# Patient Record
Sex: Female | Born: 1955
Health system: Southern US, Community
[De-identification: ages and names within clinical notes are randomized; demographics above are authoritative.]

## PROBLEM LIST (undated history)

## (undated) DIAGNOSIS — R112 Nausea with vomiting, unspecified: Secondary | ICD-10-CM

## (undated) DIAGNOSIS — R351 Nocturia: Secondary | ICD-10-CM

## (undated) DIAGNOSIS — N3010 Interstitial cystitis (chronic) without hematuria: Secondary | ICD-10-CM

## (undated) DIAGNOSIS — R3989 Other symptoms and signs involving the genitourinary system: Secondary | ICD-10-CM

## (undated) DIAGNOSIS — E785 Hyperlipidemia, unspecified: Secondary | ICD-10-CM

## (undated) DIAGNOSIS — R3915 Urgency of urination: Secondary | ICD-10-CM

## (undated) DIAGNOSIS — R350 Frequency of micturition: Secondary | ICD-10-CM

## (undated) DIAGNOSIS — Z9889 Other specified postprocedural states: Secondary | ICD-10-CM

## (undated) HISTORY — PX: TUBAL LIGATION: SHX77

## (undated) HISTORY — PX: PILONIDAL CYST EXCISION: SHX744

## (undated) HISTORY — PX: TONSILLECTOMY: SUR1361

## (undated) HISTORY — DX: Hyperlipidemia, unspecified: E78.5

## (undated) HISTORY — PX: DILATION AND CURETTAGE OF UTERUS: SHX78

---

## 1997-12-06 ENCOUNTER — Ambulatory Visit (HOSPITAL_COMMUNITY): Admission: RE | Admit: 1997-12-06 | Discharge: 1997-12-06 | Payer: Self-pay | Admitting: Obstetrics and Gynecology

## 1997-12-09 ENCOUNTER — Ambulatory Visit (HOSPITAL_COMMUNITY): Admission: RE | Admit: 1997-12-09 | Discharge: 1997-12-09 | Payer: Self-pay | Admitting: Obstetrics and Gynecology

## 1998-12-01 ENCOUNTER — Other Ambulatory Visit: Admission: RE | Admit: 1998-12-01 | Discharge: 1998-12-01 | Payer: Self-pay | Admitting: Internal Medicine

## 1998-12-12 ENCOUNTER — Ambulatory Visit (HOSPITAL_COMMUNITY): Admission: RE | Admit: 1998-12-12 | Discharge: 1998-12-12 | Payer: Self-pay | Admitting: Internal Medicine

## 1998-12-12 ENCOUNTER — Encounter: Payer: Self-pay | Admitting: Internal Medicine

## 1999-12-14 ENCOUNTER — Ambulatory Visit (HOSPITAL_COMMUNITY): Admission: RE | Admit: 1999-12-14 | Discharge: 1999-12-14 | Payer: Self-pay | Admitting: Internal Medicine

## 1999-12-14 ENCOUNTER — Encounter: Payer: Self-pay | Admitting: Internal Medicine

## 2000-01-14 ENCOUNTER — Other Ambulatory Visit: Admission: RE | Admit: 2000-01-14 | Discharge: 2000-01-14 | Payer: Self-pay | Admitting: Internal Medicine

## 2000-12-22 ENCOUNTER — Ambulatory Visit (HOSPITAL_COMMUNITY): Admission: RE | Admit: 2000-12-22 | Discharge: 2000-12-22 | Payer: Self-pay | Admitting: Internal Medicine

## 2000-12-22 ENCOUNTER — Encounter: Payer: Self-pay | Admitting: Internal Medicine

## 2001-04-02 ENCOUNTER — Other Ambulatory Visit: Admission: RE | Admit: 2001-04-02 | Discharge: 2001-04-02 | Payer: Self-pay | Admitting: Obstetrics and Gynecology

## 2001-12-23 ENCOUNTER — Ambulatory Visit (HOSPITAL_COMMUNITY): Admission: RE | Admit: 2001-12-23 | Discharge: 2001-12-23 | Payer: Self-pay | Admitting: Internal Medicine

## 2001-12-23 ENCOUNTER — Encounter: Payer: Self-pay | Admitting: Internal Medicine

## 2003-01-11 ENCOUNTER — Ambulatory Visit (HOSPITAL_COMMUNITY): Admission: RE | Admit: 2003-01-11 | Discharge: 2003-01-11 | Payer: Self-pay | Admitting: Internal Medicine

## 2003-01-25 ENCOUNTER — Other Ambulatory Visit: Admission: RE | Admit: 2003-01-25 | Discharge: 2003-01-25 | Payer: Self-pay | Admitting: Internal Medicine

## 2004-01-27 ENCOUNTER — Other Ambulatory Visit: Admission: RE | Admit: 2004-01-27 | Discharge: 2004-01-27 | Payer: Self-pay | Admitting: Internal Medicine

## 2004-02-07 ENCOUNTER — Ambulatory Visit (HOSPITAL_COMMUNITY): Admission: RE | Admit: 2004-02-07 | Discharge: 2004-02-07 | Payer: Self-pay | Admitting: Internal Medicine

## 2004-03-21 ENCOUNTER — Ambulatory Visit (HOSPITAL_COMMUNITY): Admission: RE | Admit: 2004-03-21 | Discharge: 2004-03-21 | Payer: Self-pay | Admitting: Orthopedic Surgery

## 2004-04-06 ENCOUNTER — Ambulatory Visit (HOSPITAL_COMMUNITY): Admission: RE | Admit: 2004-04-06 | Discharge: 2004-04-06 | Payer: Self-pay | Admitting: Orthopedic Surgery

## 2004-04-06 ENCOUNTER — Ambulatory Visit (HOSPITAL_BASED_OUTPATIENT_CLINIC_OR_DEPARTMENT_OTHER): Admission: RE | Admit: 2004-04-06 | Discharge: 2004-04-06 | Payer: Self-pay | Admitting: Orthopedic Surgery

## 2004-04-06 HISTORY — PX: OTHER SURGICAL HISTORY: SHX169

## 2005-02-26 ENCOUNTER — Ambulatory Visit (HOSPITAL_COMMUNITY): Admission: RE | Admit: 2005-02-26 | Discharge: 2005-02-26 | Payer: Self-pay | Admitting: Internal Medicine

## 2005-04-23 ENCOUNTER — Other Ambulatory Visit: Admission: RE | Admit: 2005-04-23 | Discharge: 2005-04-23 | Payer: Self-pay | Admitting: Internal Medicine

## 2005-10-11 ENCOUNTER — Ambulatory Visit (HOSPITAL_BASED_OUTPATIENT_CLINIC_OR_DEPARTMENT_OTHER): Admission: RE | Admit: 2005-10-11 | Discharge: 2005-10-11 | Payer: Self-pay | Admitting: Urology

## 2005-10-11 HISTORY — PX: OTHER SURGICAL HISTORY: SHX169

## 2006-02-20 ENCOUNTER — Other Ambulatory Visit: Admission: RE | Admit: 2006-02-20 | Discharge: 2006-02-20 | Payer: Self-pay | Admitting: Internal Medicine

## 2006-02-28 ENCOUNTER — Ambulatory Visit (HOSPITAL_COMMUNITY): Admission: RE | Admit: 2006-02-28 | Discharge: 2006-02-28 | Payer: Self-pay | Admitting: Internal Medicine

## 2007-03-05 ENCOUNTER — Encounter: Admission: RE | Admit: 2007-03-05 | Discharge: 2007-03-05 | Payer: Self-pay | Admitting: Internal Medicine

## 2007-03-09 ENCOUNTER — Other Ambulatory Visit: Admission: RE | Admit: 2007-03-09 | Discharge: 2007-03-09 | Payer: Self-pay | Admitting: Internal Medicine

## 2008-03-15 ENCOUNTER — Encounter: Admission: RE | Admit: 2008-03-15 | Discharge: 2008-03-15 | Payer: Self-pay | Admitting: Internal Medicine

## 2008-03-17 ENCOUNTER — Ambulatory Visit: Payer: Self-pay | Admitting: Internal Medicine

## 2008-03-17 ENCOUNTER — Other Ambulatory Visit: Admission: RE | Admit: 2008-03-17 | Discharge: 2008-03-17 | Payer: Self-pay | Admitting: Internal Medicine

## 2008-04-07 ENCOUNTER — Encounter: Admission: RE | Admit: 2008-04-07 | Discharge: 2008-04-07 | Payer: Self-pay | Admitting: Internal Medicine

## 2008-04-12 ENCOUNTER — Ambulatory Visit: Payer: Self-pay | Admitting: Internal Medicine

## 2008-09-12 ENCOUNTER — Ambulatory Visit: Payer: Self-pay | Admitting: Internal Medicine

## 2009-04-03 ENCOUNTER — Encounter: Admission: RE | Admit: 2009-04-03 | Discharge: 2009-04-03 | Payer: Self-pay | Admitting: Internal Medicine

## 2009-09-11 ENCOUNTER — Ambulatory Visit: Payer: Self-pay | Admitting: Internal Medicine

## 2009-09-11 ENCOUNTER — Other Ambulatory Visit: Admission: RE | Admit: 2009-09-11 | Discharge: 2009-09-11 | Payer: Self-pay | Admitting: Internal Medicine

## 2010-03-15 ENCOUNTER — Other Ambulatory Visit: Payer: Self-pay | Admitting: Internal Medicine

## 2010-03-15 DIAGNOSIS — Z0000 Encounter for general adult medical examination without abnormal findings: Secondary | ICD-10-CM

## 2010-03-15 DIAGNOSIS — Z1231 Encounter for screening mammogram for malignant neoplasm of breast: Secondary | ICD-10-CM

## 2010-03-17 ENCOUNTER — Encounter: Payer: Self-pay | Admitting: Internal Medicine

## 2010-04-04 ENCOUNTER — Ambulatory Visit (HOSPITAL_COMMUNITY): Admission: RE | Admit: 2010-04-04 | Payer: Self-pay | Source: Home / Self Care | Admitting: Internal Medicine

## 2010-04-04 ENCOUNTER — Ambulatory Visit (HOSPITAL_COMMUNITY): Payer: Self-pay

## 2010-04-11 ENCOUNTER — Ambulatory Visit (HOSPITAL_COMMUNITY): Payer: Self-pay

## 2010-05-01 ENCOUNTER — Ambulatory Visit (HOSPITAL_COMMUNITY)
Admission: RE | Admit: 2010-05-01 | Discharge: 2010-05-01 | Disposition: A | Discharge status: Home / Self Care | Payer: 59 | Source: Ambulatory Visit | Attending: Internal Medicine | Admitting: Internal Medicine

## 2010-05-01 DIAGNOSIS — Z1231 Encounter for screening mammogram for malignant neoplasm of breast: Secondary | ICD-10-CM

## 2010-07-10 ENCOUNTER — Other Ambulatory Visit: Payer: Self-pay | Admitting: Internal Medicine

## 2010-07-10 DIAGNOSIS — Z792 Long term (current) use of antibiotics: Secondary | ICD-10-CM

## 2010-07-10 NOTE — Telephone Encounter (Signed)
Pt requesting refill for Valtrex for prophylaxis.

## 2010-07-10 NOTE — Telephone Encounter (Signed)
Refill prn one year 

## 2010-07-13 NOTE — Op Note (Signed)
Jennifer Reyes, Jennifer Reyes NO.:  [id number]   MEDICAL RECORD NO.:  [id number]          PATIENT TYPE:  AMB   LOCATION:  [facility]                         FACILITY:  [facility]   PHYSICIAN:  Jamison Neighbor, M.D.  DATE OF BIRTH:  [birth year]   DATE OF PROCEDURE:  10/11/2005  DATE OF DISCHARGE:                                 OPERATIVE REPORT   SERVICE:  Urology.   PREOPERATIVE DIAGNOSIS:  Interstitial cystitis.   POSTOPERATIVE DIAGNOSIS:  Interstitial cystitis.   PROCEDURE:  Cystoscopy, urethral calibration, hydrodistention of the  bladder, Marcaine and Pyridium installation, Marcaine and Kenalog injection.   SURGEON:  Jamison Neighbor, M.D.   ANESTHESIA:  General.   COMPLICATIONS:  None.   DRAINS:  None.   BRIEF HISTORY:  This 55 year old female has known interstitial cystitis and  was managed for many years by Dr. Retia Passe. She primarily was treated  with cystoscopy, hydrodistention and Clorpactin installations. Her last  hydrodistention was back in the 1990s. The patient has been on oral therapy  for quite some time now with Elmiron and has done reasonably well but has  had some worsening of her symptoms and is interested in seeing if something  further can be done. Given the fact that she is already on Elmiron,  Ditropan, hydroxyzine, Klonopin, pain medication, Estrace vaginal cream and  antibiotic therapy, it is thought that she would need to either look at  repeat hydrodistention or an installation therapy. She likes the idea of a  repeat hydrodistention. She is interested in seeing the status of her  bladder. She gave full informed consent.   DESCRIPTION OF PROCEDURE:  After successful induction of general anesthesia,  the patient was placed in the dorsal lithotomy position, prepped with  Betadine and draped in the usual sterile fashion. Careful bimanual  examination revealed no cystocele, rectocele or enterocele. The uterus was  palpably normal, the urethra  was free of any lesions with no signs of  diverticulum. The urethra was calibrated at [address] [place] with female urethral  sounds with no evidence of stenosis or stricture. The cystoscope was  inserted, the bladder was carefully inspected and was free of any tumor or  stones. Both ureteral orifices were normal in configuration and location.  Hydrodistention of the bladder was then performed and the bladder was  distended at a pressure of 100 cmH2O for 5 minutes. When the bladder was  drained, glomerulations could be seen throughout the bladder consistent with  interstitial cystitis. Fortunately however she had an excellent bladder  capacity of 900 mL which is approaching normal. The average for IC is  approximately 575 and the average normal bladder capacity is 1150. The  patient had no Hunner's ulcers and there was nothing that required biopsy.  The patient's bladder was drained and a mixture of Marcaine and Pyridium  was left within the bladder, Marcaine and Kenalog were injected  periurethrally. The patient had an intraoperative B&O suppository as well as  Toradol and Zofran. She tolerated the procedure well and was taken to the  recovery room in good condition.  ______________________________  Jamison Neighbor, M.D.  Electronically Signed     RJE/MEDQ  D:  10/11/2005  T:  10/11/2005  Job:  [id number]   cc:   Laurier Nancy, M.D.  Fax: [phone]   Luanna Cole. Lenord Fellers, M.D.  Fax: [phone]

## 2010-07-13 NOTE — Op Note (Signed)
[name], [name]                ACCOUNT NO.:  [id number]   MEDICAL RECORD NO.:  [id number]          PATIENT TYPE:  AMB   LOCATION:  [facility]                          FACILITY:  [facility]   PHYSICIAN:  Katy Fitch. Sypher Montez Hageman., M.D.DATE OF BIRTH:  31-Oct-1955   DATE OF PROCEDURE:  04/06/2004  DATE OF DISCHARGE:                                 OPERATIVE REPORT   PREOPERATIVE DIAGNOSIS:  Chronic adhesive capsulitis left shoulder.   POSTOPERATIVE DIAGNOSIS:  Chronic adhesive capsulitis left shoulder.   OPERATION:  Examination of left shoulder under anesthesia followed by gentle  manipulation of adhesive capsulitis to release adhesions.   OPERATION SURGEON:  Katy Fitch. Sypher, M.D.   ASSISTANT:  Marveen Reeks. Dasnoit, P.A.C.   ANESTHESIA:  General by mask supplemented by infraclavicular block.   SUPERVISING ANESTHESIOLOGIST:  Dr. Gelene Mink.   INDICATIONS:  Jennifer Reyes is a 56 year old right-hand dominant woman who  presented more than four months prior with a history of increasing pain and  stiffness of left shoulder.   An interesting circumstance is that her mother experienced a similar cyst  predicament perhaps 10 years prior.   She developed a spontaneous onset of stiffness and pain in left shoulder  without antecedent injury.   Clinical examination suggested adhesive capsulitis of the left shoulder.  Plain films of the shoulder were unremarkable.   She was enrolled in a therapy program under the supervision of an  experienced physical therapist for three months.   Despite maximum efforts on her part, she did not make significant progress  with recovering motion.   We then sent her for an MRI which documented no significant shoulder  pathology other than adhesive capsulitis findings.   We recommended proceeding with manipulation of the left shoulder at this  time.   PROCEDURE:  Jennifer Reyes is brought to the operating room and placed in  supine position on the operating table.   Following the induction of general anesthesia by mask technique and  following placement of the infraclavicular block holding area by Dr.  Gelene Mink, the left arm was carefully ranged.   Initial range of motion revealed elevation of 120 degrees and external  rotation of only 50 degrees with her scapula stabilized. She could  internally rotate 0 degrees.   After gentle manipulation with a combined maneuver of elevation and external  rotation, we were able to increase her combined elevation of the shoulder to  175 degrees and her external rotation of the scapula stabilized to 95  degrees. Internal rotation was increased to approximately 70 degrees.   After alcohol and Betadine prep of the anterior aspect the shoulder, a  mixture of 1 mL of Depo-Medrol 40 mg per mL and 1% plain lidocaine was  injected directly into the glenohumeral joint capsule taking care to avoid  the hyaline articular cartilage of the humeral head.   Injection was accomplished without difficulty.   [name] was then awakened from sedation and transferred to the recovery  room with stable vital signs.   There were no apparent complications.      RVS/MEDQ  D:  04/06/2004  T:  04/06/2004  Job:  [id number]

## 2011-04-15 ENCOUNTER — Other Ambulatory Visit: Payer: Self-pay | Admitting: Internal Medicine

## 2011-04-15 DIAGNOSIS — Z1231 Encounter for screening mammogram for malignant neoplasm of breast: Secondary | ICD-10-CM

## 2011-05-03 ENCOUNTER — Other Ambulatory Visit: Payer: Self-pay | Admitting: Internal Medicine

## 2011-05-03 ENCOUNTER — Ambulatory Visit
Admission: RE | Admit: 2011-05-03 | Discharge: 2011-05-03 | Disposition: A | Discharge status: Home / Self Care | Payer: BC Managed Care – PPO | Source: Ambulatory Visit | Attending: Internal Medicine | Admitting: Internal Medicine

## 2011-05-03 DIAGNOSIS — R05 Cough: Secondary | ICD-10-CM

## 2011-05-03 DIAGNOSIS — R059 Cough, unspecified: Secondary | ICD-10-CM

## 2011-05-20 ENCOUNTER — Ambulatory Visit (HOSPITAL_COMMUNITY)
Admission: RE | Admit: 2011-05-20 | Discharge: 2011-05-20 | Disposition: A | Discharge status: Home / Self Care | Payer: BC Managed Care – PPO | Source: Ambulatory Visit | Attending: Internal Medicine | Admitting: Internal Medicine

## 2011-05-20 DIAGNOSIS — Z1231 Encounter for screening mammogram for malignant neoplasm of breast: Secondary | ICD-10-CM | POA: Insufficient documentation

## 2011-05-23 ENCOUNTER — Encounter: Payer: Self-pay | Admitting: Cardiology

## 2011-05-27 ENCOUNTER — Encounter: Payer: Self-pay | Admitting: *Deleted

## 2011-05-27 NOTE — Progress Notes (Signed)
This encounter was created in error - please disregard.

## 2011-06-10 ENCOUNTER — Other Ambulatory Visit: Payer: 59 | Admitting: Internal Medicine

## 2011-06-13 ENCOUNTER — Ambulatory Visit: Payer: 59 | Admitting: Internal Medicine

## 2011-07-11 ENCOUNTER — Other Ambulatory Visit: Payer: Self-pay | Admitting: Internal Medicine

## 2011-07-16 ENCOUNTER — Other Ambulatory Visit: Payer: BC Managed Care – PPO | Admitting: Internal Medicine

## 2011-07-16 DIAGNOSIS — Z0000 Encounter for general adult medical examination without abnormal findings: Secondary | ICD-10-CM

## 2011-07-16 LAB — COMPREHENSIVE METABOLIC PANEL
AST: 17 U/L (ref 0–37)
Albumin: 4.7 g/dL (ref 3.5–5.2)
Alkaline Phosphatase: 45 U/L (ref 39–117)
BUN: 20 mg/dL (ref 6–23)
Potassium: 4.3 mEq/L (ref 3.5–5.3)
Total Bilirubin: 0.6 mg/dL (ref 0.3–1.2)

## 2011-07-16 LAB — LIPID PANEL
HDL: 63 mg/dL (ref >39)
LDL Cholesterol: 96 mg/dL (ref 0–99)
Total CHOL/HDL Ratio: 2.7 Ratio
Triglycerides: 62 mg/dL (ref <150)
VLDL: 12 mg/dL (ref 0–40)

## 2011-07-16 LAB — TSH: TSH: 1.539 u[IU]/mL (ref 0.350–4.500)

## 2011-07-18 ENCOUNTER — Encounter: Payer: Self-pay | Admitting: Internal Medicine

## 2011-07-18 ENCOUNTER — Ambulatory Visit (INDEPENDENT_AMBULATORY_CARE_PROVIDER_SITE_OTHER): Payer: BC Managed Care – PPO | Admitting: Internal Medicine

## 2011-07-18 VITALS — BP 108/66 | HR 76 | Ht 63.25 in | Wt 99.0 lb

## 2011-07-18 DIAGNOSIS — N3010 Interstitial cystitis (chronic) without hematuria: Secondary | ICD-10-CM

## 2011-07-18 DIAGNOSIS — Z0000 Encounter for general adult medical examination without abnormal findings: Secondary | ICD-10-CM

## 2011-07-18 LAB — POCT URINALYSIS DIPSTICK
Blood, UA: NEGATIVE
Ketones, UA: NEGATIVE
Protein, UA: NEGATIVE
Spec Grav, UA: 1.01
Urobilinogen, UA: NEGATIVE
pH, UA: 6

## 2011-08-07 ENCOUNTER — Telehealth: Payer: Self-pay | Admitting: Internal Medicine

## 2011-08-07 NOTE — Telephone Encounter (Signed)
Dx:  733.90 (osteopenia).  Sp w/Teresa @ [facility] [phone] adv them of this dx.

## 2011-08-07 NOTE — Telephone Encounter (Signed)
Sp w/Teresa @ [facility] and she asked that I send that order over with the diagnosis since patient had the original order in her possession.  Faxed order w/dx 733.90 (osteopenia).  Adv pt of this info.

## 2011-08-08 ENCOUNTER — Telehealth: Payer: Self-pay

## 2011-08-08 ENCOUNTER — Other Ambulatory Visit: Payer: Self-pay | Admitting: Internal Medicine

## 2011-08-08 DIAGNOSIS — M899 Disorder of bone, unspecified: Secondary | ICD-10-CM

## 2011-08-09 NOTE — Telephone Encounter (Signed)
Chart opened in error

## 2011-08-21 ENCOUNTER — Other Ambulatory Visit: Payer: BC Managed Care – PPO

## 2011-08-22 ENCOUNTER — Ambulatory Visit
Admission: RE | Admit: 2011-08-22 | Discharge: 2011-08-22 | Disposition: A | Discharge status: Home / Self Care | Payer: BC Managed Care – PPO | Source: Ambulatory Visit | Attending: Internal Medicine | Admitting: Internal Medicine

## 2011-08-22 DIAGNOSIS — M949 Disorder of cartilage, unspecified: Secondary | ICD-10-CM

## 2011-08-22 DIAGNOSIS — M899 Disorder of bone, unspecified: Secondary | ICD-10-CM

## 2011-08-27 ENCOUNTER — Encounter: Payer: Self-pay | Admitting: Internal Medicine

## 2011-08-27 DIAGNOSIS — N3010 Interstitial cystitis (chronic) without hematuria: Secondary | ICD-10-CM | POA: Insufficient documentation

## 2011-08-27 NOTE — Progress Notes (Signed)
  Subjective:    Patient ID: Jennifer Reyes, female    DOB: [birth year], 56 y.o.   MRN: [id number]  HPI pleasant 56 year old white female with history of interstitial cystitis treated by urologist for many years in today for health maintenance. Evaluated for palpitations 1991, history of left frozen shoulder and right frozen shoulder. Had tonsillectomy 1961, pilonidal cyst 1974. Multiple urethral dilatations 1970 06/13/1968 08/14/1979. D&C for miscarriage 1985. Anal fissure repair with Clorpactin treatment 03/02/1992. Hydrodilatation of bladder and cystoscopy August 1997. She is allergic penicillin causes rash and hives. Also allergic to bee stings. Tetanus immunization 04/23/2005. Gets annual influenza immunization through employment and has had recent PPD which was negative.  Social history married one adult son. Husband is a for physiatrist.  Family history: Father with history of coronary artery bypass graft hypertension. Mother in good health. 3 sisters- one of them has Mnire's disease.  Patient is a nonsmoker. Does not consume alcohol. 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.    Review of Systems  Constitutional: Negative.   HENT: Negative.   Eyes: Negative.   Respiratory: Negative.   Cardiovascular: Negative.   Gastrointestinal: Negative.   Genitourinary:       History of interstitial cystitis  Musculoskeletal: Negative.   Neurological: Negative.   Hematological: Negative.   Psychiatric/Behavioral: Negative.        Objective:   Physical Exam  Vitals reviewed. Constitutional: She appears well-developed and well-nourished. No distress.  HENT:  Head: Normocephalic and atraumatic.  Right Ear: External ear normal.  Left Ear: External ear normal.  Mouth/Throat: Oropharynx is clear and moist. No oropharyngeal exudate.  Eyes: Conjunctivae and EOM are normal. Pupils are equal, round, and reactive to light. Right eye exhibits no discharge. Left eye  exhibits no discharge. No scleral icterus.  Neck: Neck supple. No JVD present. No thyromegaly present.  Cardiovascular: Normal rate, regular rhythm, normal heart sounds and intact distal pulses.   No murmur heard. Pulmonary/Chest: Effort normal. No respiratory distress. She has no wheezes. She has no rales. She exhibits no tenderness.       Breasts normal female  Abdominal: Soft. Bowel sounds are normal. She exhibits no distension and no mass. There is no tenderness. There is no rebound and no guarding.  Musculoskeletal: She exhibits no edema.  Lymphadenopathy:    She has no cervical adenopathy.  Neurological: Coordination normal.  Skin: Skin is warm and dry. No rash noted. She is not diaphoretic.  Psychiatric: She has a normal mood and affect. Her behavior is normal. Judgment and thought content normal.          Assessment & Plan:   history of interstitial cystitis followed by urology  Plan return one year or as needed. She had colonoscopy 09/25/2007. Recommend annual mammogram. Recommend bone density study every 2 years. Patient did have Zostavax vaccine a few years ago.

## 2011-09-27 ENCOUNTER — Telehealth: Payer: Self-pay | Admitting: Internal Medicine

## 2011-09-27 DIAGNOSIS — M8580 Other specified disorders of bone density and structure, unspecified site: Secondary | ICD-10-CM | POA: Insufficient documentation

## 2011-09-27 NOTE — Telephone Encounter (Signed)
Call for results regarding bone density study done in June. Basically there's been no change and maybe a little bit of improvement. Compared to results 2010, T-scores are slightly better. Recommend continuing with calcium and vitamin D supplement. Results mailed to patient including recent bone density and bone density study done 2010.

## 2011-11-06 ENCOUNTER — Other Ambulatory Visit: Payer: Self-pay | Admitting: Urology

## 2011-12-17 ENCOUNTER — Encounter (HOSPITAL_BASED_OUTPATIENT_CLINIC_OR_DEPARTMENT_OTHER): Payer: Self-pay | Admitting: *Deleted

## 2011-12-17 NOTE — Progress Notes (Signed)
NPO AFTER MN WITH EXCEPTION WATER/ GATORADE UNTIL 0700.  ARRIVES AT 1145. NEEDS HG. IF NEEDED MAY TAKE HYDROCODONE AM OF SURG W/ SIP OF WATER.

## 2011-12-23 ENCOUNTER — Encounter (HOSPITAL_BASED_OUTPATIENT_CLINIC_OR_DEPARTMENT_OTHER): Payer: Self-pay | Admitting: Anesthesiology

## 2011-12-23 ENCOUNTER — Ambulatory Visit (HOSPITAL_BASED_OUTPATIENT_CLINIC_OR_DEPARTMENT_OTHER): Payer: BC Managed Care – PPO | Admitting: Anesthesiology

## 2011-12-23 ENCOUNTER — Encounter (HOSPITAL_BASED_OUTPATIENT_CLINIC_OR_DEPARTMENT_OTHER)
Admission: RE | Disposition: A | Discharge status: Home / Self Care | Payer: Self-pay | Source: Ambulatory Visit | Attending: Urology

## 2011-12-23 ENCOUNTER — Encounter (HOSPITAL_BASED_OUTPATIENT_CLINIC_OR_DEPARTMENT_OTHER): Payer: Self-pay | Admitting: *Deleted

## 2011-12-23 ENCOUNTER — Ambulatory Visit (HOSPITAL_BASED_OUTPATIENT_CLINIC_OR_DEPARTMENT_OTHER)
Admission: RE | Admit: 2011-12-23 | Discharge: 2011-12-23 | Disposition: A | Discharge status: Home / Self Care | Payer: BC Managed Care – PPO | Source: Ambulatory Visit | Attending: Urology | Admitting: Urology

## 2011-12-23 DIAGNOSIS — N3010 Interstitial cystitis (chronic) without hematuria: Secondary | ICD-10-CM | POA: Insufficient documentation

## 2011-12-23 HISTORY — DX: Frequency of micturition: R35.0

## 2011-12-23 HISTORY — DX: Urgency of urination: R39.15

## 2011-12-23 HISTORY — DX: Other symptoms and signs involving the genitourinary system: R39.89

## 2011-12-23 HISTORY — PX: CYSTO WITH HYDRODISTENSION: SHX5453

## 2011-12-23 HISTORY — DX: Nausea with vomiting, unspecified: R11.2

## 2011-12-23 HISTORY — DX: Other specified postprocedural states: Z98.890

## 2011-12-23 HISTORY — DX: Interstitial cystitis (chronic) without hematuria: N30.10

## 2011-12-23 HISTORY — DX: Nocturia: R35.1

## 2011-12-23 LAB — POCT HEMOGLOBIN-HEMACUE: Hemoglobin: 12.7 g/dL (ref 12.0–15.0)

## 2011-12-23 SURGERY — CYSTOSCOPY, WITH BLADDER HYDRODISTENSION
Anesthesia: General | Site: Bladder | Wound class: Clean Contaminated

## 2011-12-23 MED ORDER — HYDROMORPHONE HCL PF 1 MG/ML IJ SOLN
0.2500 mg | INTRAMUSCULAR | Status: AC | PRN
  Administered 2011-12-23 (×2): 0.25 mg via INTRAVENOUS

## 2011-12-23 MED ORDER — PROMETHAZINE HCL 25 MG/ML IJ SOLN: 6.2500 mg | INTRAMUSCULAR | Status: DC | PRN

## 2011-12-23 MED ORDER — STERILE WATER FOR IRRIGATION IR SOLN
Status: DC | PRN
  Administered 2011-12-23: 1500 mL

## 2011-12-23 MED ORDER — LACTATED RINGERS IV SOLN
INTRAVENOUS | Status: DC | PRN
  Administered 2011-12-23: 12:00:00 via INTRAVENOUS

## 2011-12-23 MED ORDER — PHENAZOPYRIDINE HCL 200 MG PO TABS
200.0000 mg | ORAL_TABLET | Freq: Three times a day (TID) | ORAL | Status: DC
  Administered 2011-12-23: 200 mg via ORAL

## 2011-12-23 MED ORDER — MIDAZOLAM HCL 5 MG/5ML IJ SOLN
INTRAMUSCULAR | Status: DC | PRN
  Administered 2011-12-23: 2 mg via INTRAVENOUS

## 2011-12-23 MED ORDER — PROPOFOL 10 MG/ML IV BOLUS
INTRAVENOUS | Status: DC | PRN
  Administered 2011-12-23: 170 mg via INTRAVENOUS

## 2011-12-23 MED ORDER — LACTATED RINGERS IV SOLN
INTRAVENOUS | Status: DC
  Administered 2011-12-23 (×2): via INTRAVENOUS

## 2011-12-23 MED ORDER — HYDROCODONE-ACETAMINOPHEN 7.5-325 MG PO TABS
1.0000 | ORAL_TABLET | Freq: Four times a day (QID) | ORAL | Status: DC | PRN
  Administered 2011-12-23: 1 via ORAL

## 2011-12-23 MED ORDER — KETOROLAC TROMETHAMINE 30 MG/ML IJ SOLN
INTRAMUSCULAR | Status: DC | PRN
  Administered 2011-12-23: 30 mg via INTRAVENOUS

## 2011-12-23 MED ORDER — FENTANYL CITRATE 0.05 MG/ML IJ SOLN
INTRAMUSCULAR | Status: DC | PRN
  Administered 2011-12-23: 25 ug via INTRAVENOUS
  Administered 2011-12-23: 50 ug via INTRAVENOUS
  Administered 2011-12-23: 25 ug via INTRAVENOUS
  Administered 2011-12-23: 50 ug via INTRAVENOUS
  Administered 2011-12-23 (×2): 25 ug via INTRAVENOUS

## 2011-12-23 MED ORDER — DEXAMETHASONE SODIUM PHOSPHATE 4 MG/ML IJ SOLN
INTRAMUSCULAR | Status: DC | PRN
  Administered 2011-12-23: 10 mg via INTRAVENOUS

## 2011-12-23 MED ORDER — PHENAZOPYRIDINE HCL 200 MG PO TABS
ORAL | Status: DC | PRN
  Administered 2011-12-23: 14:00:00 via INTRAVESICAL

## 2011-12-23 MED ORDER — LIDOCAINE HCL (CARDIAC) 20 MG/ML IV SOLN
INTRAVENOUS | Status: DC | PRN
  Administered 2011-12-23: 75 mg via INTRAVENOUS

## 2011-12-23 MED ORDER — GLYCOPYRROLATE 0.2 MG/ML IJ SOLN
INTRAMUSCULAR | Status: DC | PRN
  Administered 2011-12-23: 0.2 mg via INTRAVENOUS

## 2011-12-23 MED ORDER — FENTANYL CITRATE 0.05 MG/ML IJ SOLN
25.0000 ug | INTRAMUSCULAR | Status: DC | PRN
  Administered 2011-12-23: 50 ug via INTRAVENOUS
  Administered 2011-12-23: 25 ug via INTRAVENOUS
  Administered 2011-12-23: 50 ug via INTRAVENOUS

## 2011-12-23 MED ORDER — CIPROFLOXACIN IN D5W 400 MG/200ML IV SOLN
400.0000 mg | INTRAVENOUS | Status: AC
  Administered 2011-12-23: 400 mg via INTRAVENOUS

## 2011-12-23 MED ORDER — ONDANSETRON HCL 4 MG/2ML IJ SOLN
INTRAMUSCULAR | Status: DC | PRN
  Administered 2011-12-23: 4 mg via INTRAVENOUS

## 2011-12-23 MED ORDER — KETOROLAC TROMETHAMINE 30 MG/ML IJ SOLN: 15.0000 mg | Freq: Once | INTRAMUSCULAR | Status: DC | PRN

## 2011-12-23 SURGICAL SUPPLY — 19 items
BAG DRAIN URO-CYSTO SKYTR STRL (DRAIN) ×2 IMPLANT
BAG URINE DRAINAGE (UROLOGICAL SUPPLIES) ×2 IMPLANT
CANISTER SUCT LVC 12 LTR MEDI- (MISCELLANEOUS) ×2 IMPLANT
CATH FOLEY 2WAY SLVR  5CC 18FR (CATHETERS) ×1
CATH FOLEY 2WAY SLVR 5CC 18FR (CATHETERS) ×1 IMPLANT
CATH ROBINSON RED A/P 16FR (CATHETERS) ×4 IMPLANT
CLOTH BEACON ORANGE TIMEOUT ST (SAFETY) ×2 IMPLANT
DRAPE CAMERA CLOSED 9X96 (DRAPES) ×2 IMPLANT
ELECT REM PT RETURN 9FT ADLT (ELECTROSURGICAL) ×2
ELECTRODE REM PT RTRN 9FT ADLT (ELECTROSURGICAL) ×1 IMPLANT
GLOVE BIO SURGEON STRL SZ 6.5 (GLOVE) ×4 IMPLANT
GLOVE BIO SURGEON STRL SZ7.5 (GLOVE) ×2 IMPLANT
GOWN PREVENTION PLUS LG XLONG (DISPOSABLE) ×2 IMPLANT
GOWN STRL REIN XL XLG (GOWN DISPOSABLE) ×2 IMPLANT
HOLDER FOLEY CATH W/STRAP (MISCELLANEOUS) ×2 IMPLANT
NEEDLE HYPO 22GX1.5 SAFETY (NEEDLE) ×2 IMPLANT
PACK CYSTOSCOPY (CUSTOM PROCEDURE TRAY) ×2 IMPLANT
PLUG CATH AND CAP STER (CATHETERS) ×2 IMPLANT
WATER STERILE IRR 3000ML UROMA (IV SOLUTION) ×4 IMPLANT

## 2011-12-23 NOTE — Op Note (Signed)
Preoperative diagnosis:  Interstitial Cystitis Postoperative diagnosis: Same Procedure:  Cystoscopy and hydraulic overdistention of the bladder with instillation of Pyridium and Marcaine Surgeon Valetta Fuller, MD Anesthesia: General  Indication: [name] has a long-standing history of interstitial cystitis.Jennifer Reyes  has had a recent flare and requested repeat hydraulic overdistention of the bladder.  Technique and findings:   The patient was brought to the operating room where [facility] successful induction of general anesthesia.The patient was placed in lithotomy position and prepped and draped in usual manner. Appropriate surgical timeout was performed.The patient received perioperative antibiotics. Initial cystoscopy revealed no abnormalities of the bladder. The patient underwent hydraulic overdistention of the bladder to 100 cm of water pressure for 5 minutes. Capacity was felt to be 900 cc. The patient had diffuse 4+ glomerular bleeding noted consistent with the diagnosis of interstitial cystitis. There were subtle areas of questionable small mucosal tears. In addition the patient has a history of urinary retention status post this procedure pass and therefore we felt it would be most prudent to leave an indwelling Foley catheter for 24 hours. Marcaine and Pyridium was then instilled in the bladder.The patient was brought to recovery room in stable condition having no obvious complications or problems.   Valetta Fuller, MD 12/23/2011, 1:42 PM

## 2011-12-23 NOTE — Anesthesia Preprocedure Evaluation (Signed)
Anesthesia Evaluation  Patient identified by MRN, date of birth, ID band Patient awake    Reviewed: Allergy & Precautions, H&P , NPO status , Patient's Chart, lab work & pertinent test results  History of Anesthesia Complications (+) PONV  Airway Mallampati: II TM Distance: <3 FB Neck ROM: Full    Dental No notable dental hx.    Pulmonary neg pulmonary ROS,  breath sounds clear to auscultation  Pulmonary exam normal       Cardiovascular negative cardio ROS  Rhythm:Regular Rate:Normal     Neuro/Psych negative neurological ROS  negative psych ROS   GI/Hepatic Neg liver ROS, GERD-  Medicated,  Endo/Other  negative endocrine ROS  Renal/GU negative Renal ROS  negative genitourinary   Musculoskeletal negative musculoskeletal ROS (+)   Abdominal   Peds negative pediatric ROS (+)  Hematology negative hematology ROS (+)   Anesthesia Other Findings   Reproductive/Obstetrics negative OB ROS                           Anesthesia Physical Anesthesia Plan  ASA: II  Anesthesia Plan: General   Post-op Pain Management:    Induction: Intravenous  Airway Management Planned: LMA  Additional Equipment:   Intra-op Plan:   Post-operative Plan:   Informed Consent: I have reviewed the patients History and Physical, chart, labs and discussed the procedure including the risks, benefits and alternatives for the proposed anesthesia with the patient or authorized representative who has indicated his/her understanding and acceptance.   Dental advisory given  Plan Discussed with: CRNA and Surgeon  Anesthesia Plan Comments:         Anesthesia Quick Evaluation

## 2011-12-23 NOTE — Transfer of Care (Signed)
Immediate Anesthesia Transfer of Care Note  Patient: Jennifer Reyes  Procedure(s) Performed: Procedure(s) (LRB): CYSTOSCOPY/HYDRODISTENSION (N/A)  Patient Location: PACU  Anesthesia Type: General  Level of Consciousness: awake, sedated, patient cooperative and responds to stimulation  Airway & Oxygen Therapy: Patient Spontanous Breathing and Patient connected to face mask oxygen  Post-op Assessment: Report given to PACU RN, Post -op Vital signs reviewed and stable and Patient moving all extremities  Post vital signs: Reviewed and stable  Complications: No apparent anesthesia complications

## 2011-12-23 NOTE — Anesthesia Procedure Notes (Addendum)
Performed by: Jessica Priest   Procedure Name: LMA Insertion Date/Time: 12/23/2011 1:17 PM Performed by: Jessica Priest Pre-anesthesia Checklist: Patient identified, Emergency Drugs available, Suction available and Patient being monitored Patient Re-evaluated:Patient Re-evaluated prior to inductionOxygen Delivery Method: Circle System Utilized Preoxygenation: Pre-oxygenation with 100% oxygen Intubation Type: IV induction Ventilation: Mask ventilation without difficulty LMA: LMA inserted LMA Size: 3.0 Number of attempts: 1 Airway Equipment and Method: bite block Placement Confirmation: positive ETCO2 Tube secured with: Tape Dental Injury: Teeth and Oropharynx as per pre-operative assessment

## 2011-12-23 NOTE — H&P (Signed)
Reason For Visit  Patient presents today for outpatient cystoscopy with repaired hydraulic overdistention of the bladder potential instillation for treatment of chronic interstitial cystitis. Her history from her recent office assessment as described below.  History of Present Illness  56 y/o white female with h/o chronic interstitial cystitis presents for continued evaluation and medication refills. She is maintained on Macrobid for use post coitally. She takes Elmiron 2 po BID as well as Zantac daily for histamine blockade. Pyridium prn (almost daily) and Oxybutynin 5mg  once daily or TID prn. She uses Percocet for severe pain, particularly at night to help rest. She also takes hydroxyzine qhs. She denies gross hematuria, flank pain, fever or chills. She has Cipro 500mg  to use prn for startup therapy.  She feels like she is moderately controlling her IC although her flares are gradually occuring more frequently and in increased severity when they do. She follows a strict IC diet and trys diligently to control her daily stressors. She has begun to notice increased hesitancy with voiding which is new over the past 6 months or so. Overall, she is doing well at current time. She will occasionally require home installations of marcaine/elmiron.  Her last cysto/HOD was in 2006.  Past Medical History  Problems  1. History of Chronic Interstitial Cystitis 595.1  2. History of Herpes Simplex Type II 054.10  Surgical History  Problems  1. History of Colonoscopy (Fiberoptic)  2. History of Cystoscopy (Diagnostic)  3. History of Cystoscopy With Dilation Of Bladder  Current Meds  1. Calcium + D TABS; 600mg ; 1 per day; Therapy: (Recorded:05Apr2012) to  2. Ciprofloxacin HCl 250 MG Oral Tablet; TAKE 1 TABLET BY MOUTH TWICE A DAY; Therapy:  (Recorded:02May2013) to  3. Endocet 7.5-325 MG Oral Tablet; TAKE 1 TABLET Every 6 hours PRN Pain; Therapy: 23Aug2013  to (Last Rx:23Aug2013)  4. Estrace 0.1 MG/GM Vaginal  Cream; 2GRAMS INTERNALLY & 1/2G EXTERNALLY 3 TIMES/WEEK  FOR 1 MONTH,THEN ONCEWEEKLY; Therapy: 14May2010 to (Evaluate:30Oct2013) Requested  for: 29Jul2013; Last Rx:29Jul2013  5. Halobetasol Propionate 0.05 % External Ointment; Therapy: 29Jul2013 to  6. Pyridium Plus TABS; 1 tablet as needed; Therapy: (Recorded:05Apr2012) to  7. Sprix 15.75 MG/SPRAY Nasal Solution; 1 squirt to each nostril Q6hrs prn; Therapy: 02May2013 to  (Last Rx:02May2013)  8. Valtrex 500 MG Oral Tablet; Therapy: 27Mar2013 to  9. Vitamin D 1000 UNIT Oral Capsule; Therapy: 27Mar2013 to  Allergies  Medication  1. Penicillins  2. Demerol TABS  Family History  Problems  1. Family history of Family Health Status Children ___ Living Sons  1 SON  2. Paternal history of Heart Disease V17.49  3. Paternal history of Hypertension V17.49  4. Maternal history of Nephrolithiasis  Social History  Problems  1. Marital History - Currently Married  2. Never A Smoker  3. 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 History of Alcohol Use  5. History of Caffeine Use  6. History of Tobacco Use V15.82  Review of Systems  Genitourinary, constitutional, skin, eye, otolaryngeal, hematologic/lymphatic, cardiovascular, pulmonary, endocrine, musculoskeletal, gastrointestinal, neurological and psychiatric system(s) were reviewed and pertinent findings if present are noted.  Genitourinary: urinary frequency, urinary urgency, nocturia and suprapubic pain.  Vitals  BMI Calculated: 18.03  BSA Calculated: 1.41  Height: 5 ft 2 in  Weight: 98 lb  Blood Pressure: 103 / 63  Heart Rate: 74  Physical Exam  Constitutional: Well nourished and well developed . No acute distress.  ENT:. The ears and nose are normal in appearance.  Neck: The appearance of the neck is normal and no neck mass is present.  Pulmonary: No respiratory distress and normal respiratory rhythm and effort.  Cardiovascular:. No peripheral edema.  Skin:  Normal skin turgor, no visible rash and no visible skin lesions.  Neuro/Psych:. Mood and affect are appropriate.  Results/Data  Urine  COLOR YELLOW  APPEARANCE CLEAR  SPECIFIC GRAVITY <1.005  pH 6.0  GLUCOSE NEG mg/dL  BILIRUBIN NEG  KETONE NEG mg/dL  BLOOD NEG  PROTEIN NEG mg/dL  UROBILINOGEN 0.2 mg/dL  NITRITE NEG  LEUKOCYTE ESTERASE NEG  The following clinical lab reports were reviewed: Marland Kitchen Urinalysis Selected Results  UA With REFLEX  Test Name Result Flag Reference  COLOR YELLOW YELLOW  APPEARANCE CLEAR CLEAR  SPECIFIC GRAVITY <1.005 L 1.005-1.030  pH 6.0 5.0-8.0  GLUCOSE NEG mg/dL NEG  BILIRUBIN NEG NEG  KETONE NEG mg/dL NEG  BLOOD NEG NEG  PROTEIN NEG mg/dL NEG  UROBILINOGEN 0.2 mg/dL 1.9-1.4  NITRITE NEG NEG  LEUKOCYTE ESTERASE NEG NEG  Assessment  Assessed  1. Bladder Pain 788.99  2. Chronic Interstitial Cystitis 595.1  3. Female Pelvic Pain 625.9  Chronic interstitial cystitis managed with multiple medications. She feels like her flares are occurring more frequently and with increased severity despite her strict regimen. She feels like she need reeat HOD procudere. I feel this is appropriate especially in light of new onset urinary hesitancy.  Plan  Bladder Pain (788.99)  1. Bupivacaine HCl 0.5 % Injection Solution; USE AS DIRECTED; Therapy: 26Aug2013 to (Last  Rx:26Aug2013) Requested for: 26Aug2013; Edited  2. Lidocaine HCl 2 % External Gel; APPLY 1 GM Bedtime may use externally prn; Therapy:  26Aug2013 to (Last Rx:26Aug2013) Requested for: 26Aug2013; Edited  Chronic Interstitial Cystitis (595.1)  3. ClonazePAM 0.5 MG Oral Tablet; TAKE 1 TABLET ONCE DAILY; Therapy: 14May2010 to  (Evaluate:22Feb2014) Requested for: 26Aug2013; Last Rx:26Aug2013  4. Elmiron 100 MG Oral Capsule; TAKE (2) CAPSULES TWICE DAILY; Therapy: 14Jun2012 to  (Evaluate:22Feb2014) Requested for: 26Aug2013; Last Rx:26Aug2013; Edited  5. HydrOXYzine HCl 10 MG Oral Tablet; TAKE ONE TABLET AT  BEDTIME; Therapy: 16Dec2009  to (Evaluate:21Aug2014) Requested for: 26Aug2013; Last Rx:26Aug2013; Edited  6. Nitrofurantoin Macrocrystal 100 MG Oral Capsule; TAKE 1 PO Q HS - REPEAT DOSE POST  COITAL Requested for: 26Aug2013; Last Rx:26Aug2013; Edited  7. Phenazopyridine HCl 100 MG Oral Tablet; TAKE 1 TABLET BY MOUTH EVERY 6 HOURS AS  NEEDED; Therapy: 26Aug2013 to (Evaluate:22Feb2014) Requested for: 26Aug2013; Last  Rx:26Aug2013; Edited  Chronic Interstitial Cystitis (595.1), Dysuria (788.1)  8. Ciprofloxacin HCl 500 MG Oral Tablet; TAKE 1 TABLET TWICE DAILY; Therapy: 16Jul2012 to  (Evaluate:15Sep2013) Requested for: 26Aug2013; Last Rx:26Aug2013; Edited  Chronic Interstitial Cystitis (595.1), Female Pelvic Pain (625.9)  9. Ranitidine HCl 150 MG Oral Tablet; TAKE 1 TABLET DAILY Requested for: 26Aug2013; Last  Rx:26Aug2013; Edited  Feelings Of Urinary Urgency (788.63)  10. Oxybutynin Chloride 5 MG Oral Tablet; take 1 tablet by mouth three times a day Requested for:  26Aug2013; Last Rx:26Aug2013; Edited  Female Pelvic Pain (625.9)  11. Follow-up NP/PA Office Follow-up Requested for: 27Feb2014  Health Maintenance (V70.0)  12. UA With REFLEX Done: 26Aug2013 10:20AM  Multiple Rxs renewed. Will schedule cysto/HOD for mid-October at pt request.  Patient has had this procedure in the past. She understands the risks and benefits were like to proceed.

## 2011-12-23 NOTE — Interval H&P Note (Signed)
History and Physical Interval Note:  12/23/2011 12:48 PM  Jennifer Reyes  has presented today for surgery, with the diagnosis of INTERSTITIAL CYSTITIS  The various methods of treatment have been discussed with the patient and family. After consideration of risks, benefits and other options for treatment, the patient has consented to  Procedure(s) (LRB) with comments: CYSTOSCOPY/HYDRODISTENSION (N/A) - 30 MIN CYSTO, HYDRODISTENSION, URETHRAL CALIBRATION, INSTILLATION MARCAINEAND PYRIDIUM, INJECTION MARCAINE AND KENALOG  as a surgical intervention .  The patient's history has been reviewed, patient examined, no change in status, stable for surgery.  I have reviewed the patient's chart and labs.  Questions were answered to the patient's satisfaction.     [name]

## 2011-12-24 ENCOUNTER — Encounter (HOSPITAL_BASED_OUTPATIENT_CLINIC_OR_DEPARTMENT_OTHER): Payer: Self-pay | Admitting: Urology

## 2011-12-24 NOTE — Anesthesia Postprocedure Evaluation (Signed)
  Anesthesia Post-op Note  Patient: Jennifer Reyes  Procedure(s) Performed: Procedure(s) (LRB): CYSTOSCOPY/HYDRODISTENSION (N/A)  Patient Location: PACU  Anesthesia Type: General  Level of Consciousness: awake and alert   Airway and Oxygen Therapy: Patient Spontanous Breathing  Post-op Pain: mild  Post-op Assessment: Post-op Vital signs reviewed, Patient's Cardiovascular Status Stable, Respiratory Function Stable, Patent Airway and No signs of Nausea or vomiting  Post-op Vital Signs: stable  Complications: No apparent anesthesia complications

## 2011-12-24 NOTE — Progress Notes (Signed)
Instructed to call Dr Carmell Austria office due to  possible need for antibiotic since self cath still  necessary

## 2012-02-26 LAB — HM COLONOSCOPY

## 2012-04-11 ENCOUNTER — Other Ambulatory Visit: Payer: Self-pay

## 2012-04-27 ENCOUNTER — Other Ambulatory Visit: Payer: Self-pay | Admitting: Internal Medicine

## 2012-04-27 DIAGNOSIS — Z1231 Encounter for screening mammogram for malignant neoplasm of breast: Secondary | ICD-10-CM

## 2012-05-20 ENCOUNTER — Ambulatory Visit (HOSPITAL_COMMUNITY): Payer: BC Managed Care – PPO

## 2012-06-03 ENCOUNTER — Other Ambulatory Visit: Payer: Self-pay | Admitting: Orthopedic Surgery

## 2012-06-03 NOTE — H&P (Signed)
  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    Jennifer Reyes has had a number of predicaments we have cared for over the years.  She now presents for evaluation of a rapidly growing mass on the dorsal aspect of her left ring finger proximal phalangeal segment.  She had an area that looked like a possible infected hair follicle or bug bite.  In a matter of weeks she developed a raised papule, 7 mm. in diameter.  This has a verrucous type core consistent with a possible aggressive verrucous lesion or a keratoacanthoma.  She recalls no injury in this region nor recognized bug bite.     PAST MEDICAL HISTORY:     ALLERGIES:   Penicillin.  MEDICATIONS:    Elmiron 100 mg. daily, oxybutynin 5 mg. daily,  clonazepam 0.5 mg. PRN , hydroxyzine 10 mg. PRN and hydrocodone 7.5/325 PRN.  SURGICAL HISTORY:     Procedures for IC treatment.  SOCIAL, FAMILY HISTORIES AND REVIEW OF SYSTEMS:  Detailed, brought up to date, otherwise unchanged. She wears corrective lenses for reading.  PHYSICAL EXAMINATION:     She is a well appearing 57 year-old woman who looks at least ten years younger than her stated age.  Inspection of her hand reveals the aforementioned papular lesion with a keratotic core.  This has a surrounding halo of inflammation consistent with a keratoacanthoma or verrucous lesion with inflammation.  She has no impairment of finger range of motion.   RADIOGRAPHS:   X-ray of her hand is unremarkable except for some joint space narrowing at her thumb CMC joints.   ASSESSMENT:     Verrucous lesion vs. keratoacanthoma vs. atypical foreign body reaction.  RECOMMENDATIONS/PLAN:   I have advised Edie to proceed with resection under local anesthesia for a histologic diagnosis.  We will schedule this on an outpatient basis at her convenience at the [facility].  Questions were invited and  answered.   H&P documentation: 06/04/2012  -History and Physical Reviewed  -Patient has been re-examined  -No change in the plan of care  Wyn Forster, MD

## 2012-06-04 ENCOUNTER — Encounter (HOSPITAL_BASED_OUTPATIENT_CLINIC_OR_DEPARTMENT_OTHER)
Admission: RE | Disposition: A | Discharge status: Home / Self Care | Payer: Self-pay | Source: Ambulatory Visit | Attending: Orthopedic Surgery

## 2012-06-04 ENCOUNTER — Encounter (HOSPITAL_BASED_OUTPATIENT_CLINIC_OR_DEPARTMENT_OTHER): Payer: Self-pay | Admitting: *Deleted

## 2012-06-04 ENCOUNTER — Ambulatory Visit (HOSPITAL_BASED_OUTPATIENT_CLINIC_OR_DEPARTMENT_OTHER)
Admission: RE | Admit: 2012-06-04 | Discharge: 2012-06-04 | Disposition: A | Discharge status: Home / Self Care | Payer: BC Managed Care – PPO | Source: Ambulatory Visit | Attending: Orthopedic Surgery | Admitting: Orthopedic Surgery

## 2012-06-04 DIAGNOSIS — Z880 Allergy status to penicillin: Secondary | ICD-10-CM | POA: Insufficient documentation

## 2012-06-04 DIAGNOSIS — B079 Viral wart, unspecified: Secondary | ICD-10-CM | POA: Insufficient documentation

## 2012-06-04 DIAGNOSIS — Z79899 Other long term (current) drug therapy: Secondary | ICD-10-CM | POA: Insufficient documentation

## 2012-06-04 HISTORY — PX: MASS EXCISION: SHX2000

## 2012-06-04 SURGERY — MINOR EXCISION OF MASS
Anesthesia: LOCAL | Site: Finger | Laterality: Left | Wound class: Clean

## 2012-06-04 MED ORDER — CHLORHEXIDINE GLUCONATE 4 % EX LIQD: 60.0000 mL | Freq: Once | CUTANEOUS | Status: DC

## 2012-06-04 MED ORDER — LIDOCAINE HCL 2 % IJ SOLN
INTRAMUSCULAR | Status: DC | PRN
  Administered 2012-06-04: 2.5 mL

## 2012-06-04 SURGICAL SUPPLY — 37 items
BANDAGE ADHESIVE 1X3 (GAUZE/BANDAGES/DRESSINGS) IMPLANT
BLADE SURG 15 STRL LF DISP TIS (BLADE) ×1 IMPLANT
BLADE SURG 15 STRL SS (BLADE) ×1
BNDG COHESIVE 1X5 TAN STRL LF (GAUZE/BANDAGES/DRESSINGS) ×4 IMPLANT
BNDG ELASTIC 2 VLCR STRL LF (GAUZE/BANDAGES/DRESSINGS) IMPLANT
BNDG ESMARK 4X9 LF (GAUZE/BANDAGES/DRESSINGS) ×2 IMPLANT
BRUSH SCRUB EZ PLAIN DRY (MISCELLANEOUS) ×2 IMPLANT
CLOTH BEACON ORANGE TIMEOUT ST (SAFETY) IMPLANT
CORDS BIPOLAR (ELECTRODE) IMPLANT
COVER MAYO STAND STRL (DRAPES) ×2 IMPLANT
CUFF TOURN SGL LL 12 (TOURNIQUET CUFF) ×2 IMPLANT
CUFF TOURNIQUET SINGLE 18IN (TOURNIQUET CUFF) IMPLANT
DECANTER SPIKE VIAL GLASS SM (MISCELLANEOUS) IMPLANT
DRAIN PENROSE 1/2X12 LTX STRL (WOUND CARE) IMPLANT
DRAPE SURG 17X23 STRL (DRAPES) ×2 IMPLANT
GAUZE SPONGE 4X4 12PLY STRL LF (GAUZE/BANDAGES/DRESSINGS) ×4 IMPLANT
GAUZE XEROFORM 1X8 LF (GAUZE/BANDAGES/DRESSINGS) IMPLANT
GLOVE BIO SURGEON STRL SZ 6.5 (GLOVE) ×2 IMPLANT
GLOVE BIOGEL M STRL SZ7.5 (GLOVE) ×2 IMPLANT
GLOVE ORTHO TXT STRL SZ7.5 (GLOVE) ×2 IMPLANT
GOWN BRE IMP PREV XXLGXLNG (GOWN DISPOSABLE) ×4 IMPLANT
GOWN PREVENTION PLUS XLARGE (GOWN DISPOSABLE) IMPLANT
NEEDLE 27GAX1X1/2 (NEEDLE) ×2 IMPLANT
PACK BASIN DAY SURGERY FS (CUSTOM PROCEDURE TRAY) ×2 IMPLANT
PADDING CAST ABS 4INX4YD NS (CAST SUPPLIES)
PADDING CAST ABS COTTON 4X4 ST (CAST SUPPLIES) IMPLANT
SPONGE GAUZE 4X4 12PLY (GAUZE/BANDAGES/DRESSINGS) ×2 IMPLANT
STOCKINETTE 4X48 STRL (DRAPES) IMPLANT
STRIP CLOSURE SKIN 1/2X4 (GAUZE/BANDAGES/DRESSINGS) ×2 IMPLANT
SUT ETHILON 5 0 P 3 18 (SUTURE)
SUT NYLON ETHILON 5-0 P-3 1X18 (SUTURE) IMPLANT
SUT PROLENE 4 0 P 3 18 (SUTURE) ×2 IMPLANT
SYR 3ML 23GX1 SAFETY (SYRINGE) IMPLANT
SYR CONTROL 10ML LL (SYRINGE) IMPLANT
TOWEL OR 17X24 6PK STRL BLUE (TOWEL DISPOSABLE) ×4 IMPLANT
TRAY DSU PREP LF (CUSTOM PROCEDURE TRAY) ×2 IMPLANT
UNDERPAD 30X30 INCONTINENT (UNDERPADS AND DIAPERS) ×2 IMPLANT

## 2012-06-04 NOTE — Op Note (Signed)
[id number] 

## 2012-06-04 NOTE — Brief Op Note (Signed)
06/04/2012  9:31 AM  PATIENT:  Jennifer Reyes  57 y.o. female  PRE-OPERATIVE DIAGNOSIS:  LESION LEFT RING FINGER  POST-OPERATIVE DIAGNOSIS:  Lesion Left Ring Finger  PROCEDURE:  Procedure(s): EXCISIONAL BIOPSY OF MASS LEFT RING FINGER (Left)  SURGEON:  Surgeon(s) and Role:    * Wyn Forster., MD - Primary  PHYSICIAN ASSISTANT:   ASSISTANTS:Leianna Barga Dasnoit,P.A-C    ANESTHESIA:   local  EBL:     BLOOD ADMINISTERED:none  DRAINS: none   LOCAL MEDICATIONS USED:  XYLOCAINE   SPECIMEN:  Excision  DISPOSITION OF SPECIMEN:  PATHOLOGY  COUNTS:  YES  TOURNIQUET:   Total Tourniquet Time Documented: Forearm (Left) - 6 minutes Total: Forearm (Left) - 6 minutes   DICTATION: .Other Dictation: Dictation Number [id number]  PLAN OF CARE: Discharge to home after PACU  PATIENT DISPOSITION:  PACU - hemodynamically stable.   Delay start of Pharmacological VTE agent (>24hrs) due to surgical blood loss or risk of bleeding: not applicable

## 2012-06-05 ENCOUNTER — Encounter (HOSPITAL_BASED_OUTPATIENT_CLINIC_OR_DEPARTMENT_OTHER): Payer: Self-pay | Admitting: Orthopedic Surgery

## 2012-06-05 NOTE — Op Note (Signed)
NAMETATAYANA, BESHEARS NO.:  [id number]  MEDICAL RECORD NO.:  [id number]  LOCATION:                                 FACILITY:  PHYSICIAN:  Katy Fitch. Camil Hausmann, M.D. DATE OF BIRTH:  [birth year]  DATE OF PROCEDURE:  06/04/2012 DATE OF DISCHARGE:                              OPERATIVE REPORT   PREOPERATIVE DIAGNOSIS:  Rapidly enlarging mass of left ring finger, dorsal aspect proximal phalangeal segment, rule out keratoacanthoma versus aggressive verrucous lesion.  POSTOPERATIVE DIAGNOSIS:  Rapidly enlarging mass of left ring finger, dorsal aspect proximal phalangeal segment, rule out keratoacanthoma versus aggressive verrucous lesion.  OPERATION:  Marginal resection of lesion dorsal aspect of left ring finger proximal phalangeal segment with full-thickness skin biopsy.  OPERATING SURGEON:  Katy Fitch. Airelle Everding, M.D.  ASSISTANT:  Jonni Sanger, P.A.  ANESTHESIA:  2% lidocaine field block 2.5 mL.  This was performed as a minor operating room procedure.  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 She has been a longstanding patient and family friend.  She developed a lesion of the dorsal aspect of her left ring finger that has grown rapidly within the past week.  It had a surrounding inflammatory border. She was unaware of any penetrating injury in this region, and could not recall an insect bite.  There was no sign of sepsis.  X-rays of her hand were unremarkable.  We recommended excisional biopsy for diagnosis and probable resolution of this predicament.  Clinically, this had a __________ which likely represented a keratoacanthoma or potentially a aggressive verrucous lesion.  Preoperatively, she was reminded of the potential risks and benefits of this surgery.  We recommended proceeding under local anesthesia.  PROCEDURE:  Modesta Sammons was brought to room 1 of the [facility] and placed in a supine position on the operating table. Following anesthesia, informed consent and Betadine prep, 2.5 mL of 2% plain lidocaine were infiltrated subcutaneously deep to the lesion and to block the dorsal radial and ulnar sensory branches of the long finger.  After 5 minutes, excellent anesthesia was achieved.  Her hand and arm were then prepped with Betadine soap and solution, sterilely draped.  A pneumatic tourniquet was applied to the proximal forearm.  Following routine surgical time-out, the hand and arm were exsanguinated by direct compression and the arterial tourniquet on the forearm inflated to 220 mmHg.  Procedure commenced with an elliptical skin excision with a 2 mm margin surrounding the area of erythema.  The biopsy was taken directly to the subcutaneous tissue overlying the tendon.  There was no infiltrating into the subcutaneous tissue identified.  A full-thickness skin biopsy was accomplished and the specimen placed immediately in formalin.  The wound was inspected for bleeding points, subsequently repaired with intradermal 4-0 Prolene suture and Steri-Strips.  For aftercare, she was placed in compressive dressing.  She will keep her hand clean and dry for 1 week.  We will see her back in followup in our office for followup evaluation and discussion of her biopsy in 7 days.  Question invited and answered.     Molly Maduro  Dot Been, M.D.     RVS/MEDQ  D:  06/04/2012  T:  06/04/2012  Job:  [id number]

## 2012-07-08 ENCOUNTER — Ambulatory Visit (HOSPITAL_COMMUNITY)
Admission: RE | Admit: 2012-07-08 | Discharge: 2012-07-08 | Disposition: A | Discharge status: Home / Self Care | Payer: BC Managed Care – PPO | Source: Ambulatory Visit | Attending: Internal Medicine | Admitting: Internal Medicine

## 2012-07-08 ENCOUNTER — Ambulatory Visit (HOSPITAL_COMMUNITY): Payer: BC Managed Care – PPO

## 2012-07-08 DIAGNOSIS — Z1231 Encounter for screening mammogram for malignant neoplasm of breast: Secondary | ICD-10-CM | POA: Insufficient documentation

## 2012-08-12 ENCOUNTER — Other Ambulatory Visit: Payer: Self-pay | Admitting: Internal Medicine

## 2012-11-26 ENCOUNTER — Encounter: Payer: Self-pay | Admitting: Internal Medicine

## 2012-12-11 ENCOUNTER — Encounter: Payer: Self-pay | Admitting: Internal Medicine

## 2012-12-11 ENCOUNTER — Ambulatory Visit (INDEPENDENT_AMBULATORY_CARE_PROVIDER_SITE_OTHER): Payer: BC Managed Care – PPO | Admitting: Internal Medicine

## 2012-12-11 VITALS — BP 110/76 | HR 76 | Temp 97.9°F | Ht 64.0 in | Wt 98.0 lb

## 2012-12-11 DIAGNOSIS — R079 Chest pain, unspecified: Secondary | ICD-10-CM

## 2012-12-11 DIAGNOSIS — Z13220 Encounter for screening for lipoid disorders: Secondary | ICD-10-CM

## 2012-12-11 DIAGNOSIS — Z0000 Encounter for general adult medical examination without abnormal findings: Secondary | ICD-10-CM

## 2012-12-11 DIAGNOSIS — R002 Palpitations: Secondary | ICD-10-CM

## 2012-12-11 DIAGNOSIS — N3010 Interstitial cystitis (chronic) without hematuria: Secondary | ICD-10-CM

## 2012-12-11 DIAGNOSIS — Z130 Encounter for screening for diseases of the blood and blood-forming organs and certain disorders involving the immune mechanism: Secondary | ICD-10-CM

## 2012-12-11 LAB — LIPID PANEL
HDL: 73 mg/dL (ref >39)
LDL Cholesterol: 98 mg/dL (ref 0–99)
Total CHOL/HDL Ratio: 2.5 Ratio
Triglycerides: 61 mg/dL (ref <150)
VLDL: 12 mg/dL (ref 0–40)

## 2012-12-11 LAB — POCT URINALYSIS DIPSTICK
Bilirubin, UA: NEGATIVE
Blood, UA: NEGATIVE
Ketones, UA: NEGATIVE
Leukocytes, UA: NEGATIVE
Spec Grav, UA: 1.015
pH, UA: 5.5

## 2013-01-25 ENCOUNTER — Ambulatory Visit: Payer: BC Managed Care – PPO | Admitting: Podiatry

## 2013-03-16 ENCOUNTER — Other Ambulatory Visit: Payer: Self-pay | Admitting: Internal Medicine

## 2013-04-23 ENCOUNTER — Encounter: Payer: Self-pay | Admitting: Internal Medicine

## 2013-04-23 NOTE — Patient Instructions (Signed)
Return in one year or as needed. Palpitations/near syncope recur, see cardiologist

## 2013-04-23 NOTE — Progress Notes (Signed)
   Subjective:    Patient ID: Jennifer Reyes, female    DOB: 01/27/1956, 58 y.o.   MRN: [id number]  HPI 58 year old White Female with history of interstitial cystitis treated by urologist for many years in today for health maintenance. Evaluated for palpitations in 1991, history of left frozen shoulder and right frozen shoulder. Had tonsillectomy 1961, pilonidal cyst 1974. Multiple urethral dilatations 1970 and 1991. D&C for miscarriage 1995. Anal fissure repair with Clorpactin treatment January 1994. Hydrodilatation of bladder and cystoscopy August 1997.  She is allergic to penicillin-causes rash and hives. Also is allergic to bee stings.  Tetanus immunization done 04/23/2005. Gets annual influenza immunization through employment.  Social history: Married one adult son. Husband is a Biochemist, clinicalhysiatrist. Patient is a nonsmoker. Does not consume alcohol. Has a BS degree in Tree surgeonMedical Technology and works at the [employer].  Family history: Father with history of coronary artery bypass graft surgery and hypertension. Mother in good health. 3 sisters one of them has Mnire's disease.    Review of Systems  Constitutional: Negative.   HENT: Negative.   Eyes: Negative.   Respiratory: Negative.   Cardiovascular:       Recent issue with palpitations and near syncope  Gastrointestinal: Negative.   Endocrine: Negative.   Genitourinary:       Long-standing history of interstitial cystitis  Hematological: Negative.   Psychiatric/Behavioral: Negative.        Objective:   Physical Exam  Vitals reviewed. Constitutional: She is oriented to person, place, and time. She appears well-developed and well-nourished. No distress.  HENT:  Head: Normocephalic and atraumatic.  Left Ear: External ear normal.  Mouth/Throat: Oropharynx is clear and moist. No oropharyngeal exudate.  Eyes: Conjunctivae and EOM are normal. Pupils are equal, round, and reactive to light. Right eye exhibits no discharge. Left eye  exhibits no discharge. No scleral icterus.  Neck: Neck supple. No JVD present. No thyromegaly present.  Cardiovascular: Normal rate, regular rhythm, normal heart sounds and intact distal pulses.   No murmur heard. Pulmonary/Chest: Effort normal and breath sounds normal. No respiratory distress. She has no rales. She exhibits no tenderness.  Abdominal: Bowel sounds are normal. She exhibits no distension and no mass. There is no tenderness. There is no rebound and no guarding.  Genitourinary:  GYN is Dr. Ambrose MantleHenley. Pap 2010  Musculoskeletal: Normal range of motion. She exhibits no edema.  Neurological: She is alert and oriented to person, place, and time. She has normal reflexes. She displays normal reflexes. No cranial nerve deficit. She exhibits normal muscle tone. Coordination normal.  Skin: Skin is warm and dry. No rash noted. She is not diaphoretic.  Psychiatric: She has a normal mood and affect. Her behavior is normal. Judgment and thought content normal.          Assessment & Plan:  History of interstitial cystitis  Recent episode of palpitations and near syncope. Recommend cardiology consultation.  Plan: Patient would like to see Dr. Elease HashimotoNahser if possible before end of the year due to insurance deductible.  Addendum: Appointment cannot be made to see Dr. Elease HashimotoNahser before first of year. Patient will defer cardiology consultation.

## 2013-06-03 ENCOUNTER — Other Ambulatory Visit: Payer: Self-pay | Admitting: Internal Medicine

## 2013-10-01 ENCOUNTER — Other Ambulatory Visit: Payer: Self-pay | Admitting: Internal Medicine

## 2013-10-01 DIAGNOSIS — Z780 Asymptomatic menopausal state: Secondary | ICD-10-CM

## 2013-11-08 ENCOUNTER — Other Ambulatory Visit: Payer: BC Managed Care – PPO

## 2013-11-26 ENCOUNTER — Ambulatory Visit
Admission: RE | Admit: 2013-11-26 | Discharge: 2013-11-26 | Disposition: A | Discharge status: Home / Self Care | Payer: BC Managed Care – PPO | Source: Ambulatory Visit | Attending: Internal Medicine | Admitting: Internal Medicine

## 2013-11-26 DIAGNOSIS — Z780 Asymptomatic menopausal state: Secondary | ICD-10-CM

## 2013-11-29 ENCOUNTER — Other Ambulatory Visit: Payer: Self-pay | Admitting: Internal Medicine

## 2013-11-29 DIAGNOSIS — Z1329 Encounter for screening for other suspected endocrine disorder: Secondary | ICD-10-CM

## 2013-11-29 DIAGNOSIS — Z0000 Encounter for general adult medical examination without abnormal findings: Secondary | ICD-10-CM

## 2013-11-29 DIAGNOSIS — Z13220 Encounter for screening for lipoid disorders: Secondary | ICD-10-CM

## 2013-11-29 LAB — LIPID PANEL
Cholesterol: 189 mg/dL (ref 0–200)
HDL: 65 mg/dL (ref >39)
LDL Cholesterol: 104 mg/dL — ABNORMAL HIGH (ref 0–99)
Total CHOL/HDL Ratio: 2.9 Ratio
Triglycerides: 98 mg/dL (ref <150)
VLDL: 20 mg/dL (ref 0–40)

## 2013-11-30 LAB — VITAMIN D 25 HYDROXY (VIT D DEFICIENCY, FRACTURES): VIT D 25 HYDROXY: 58 ng/mL (ref 30–89)

## 2013-12-10 ENCOUNTER — Other Ambulatory Visit: Payer: BC Managed Care – PPO | Admitting: Internal Medicine

## 2013-12-13 ENCOUNTER — Encounter: Payer: Self-pay | Admitting: Internal Medicine

## 2013-12-13 ENCOUNTER — Ambulatory Visit (INDEPENDENT_AMBULATORY_CARE_PROVIDER_SITE_OTHER): Payer: BC Managed Care – PPO | Admitting: Internal Medicine

## 2013-12-13 VITALS — BP 98/70 | HR 70 | Temp 98.2°F | Ht 63.25 in | Wt 103.0 lb

## 2013-12-13 DIAGNOSIS — Z8679 Personal history of other diseases of the circulatory system: Secondary | ICD-10-CM

## 2013-12-13 DIAGNOSIS — N3010 Interstitial cystitis (chronic) without hematuria: Secondary | ICD-10-CM

## 2013-12-13 DIAGNOSIS — Z87898 Personal history of other specified conditions: Secondary | ICD-10-CM

## 2014-03-05 ENCOUNTER — Encounter: Payer: Self-pay | Admitting: Internal Medicine

## 2014-03-05 NOTE — Patient Instructions (Signed)
Return in one year or as needed. 

## 2014-03-05 NOTE — Progress Notes (Signed)
   Subjective:    Patient ID: Jennifer Reyes, female    DOB: 04/17/1955, 59 y.o.   MRN: [id number]  HPI  59 year old Female in today for health maintenance exam and evaluation of medical issues. 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 Urinalysis was normal. White blood cell count normal hemoglobin 13.5 g. Platelet count 278,000. Differential normal. Electrolytes and glucose normal. Liver functions normal. TSH normal. Lipid panel entirely normal with total cholesterol of 189, LDL cholesterol of 20, triglycerides of 98. Vitamin D normal at 58.  History of interstitial cystitis treated by urologist for many years. History of palpitations 1991. History of left frozen shoulder and right lotion shoulder. Had tonsillectomy 1961, pilonidal cyst 1974. Multiple urethral dilatations 1970 and 1991. D&C for miscarriage 1995. Anal fissure repair with Clorpactin treatment 1994. Hydrodilatation of bladder and cystoscopy in August 1997. Cystoscopy and hydrodilatation of bladder 2013 by Dr. Isabel CapriceGrapey. Excision of benign mass left ring finger 2014.  She is allergic to penicillin causes rash and hives. Also allergic to bee stings.  Tetanus immunization done 04/23/2005. Gets annual influenza immunization through employment.  Social history: Married with one adult son. Husband is a Biochemist, clinicalhysiatrist. Patient is a nonsmoker. Does not consume alcohol. Has a BS degree in Medical Technology works at the [employer].  Family history: Father with history of coronary artery bypass graft surgery and hypertension. Mother in good health. 3 sisters-one of them has Mnire's disease    Review of Systems  Constitutional: Negative.   HENT: Negative.   Eyes: Negative.   Respiratory: Negative.   Gastrointestinal: Negative.   Genitourinary:       Long-standing history of interstitial cystitis. Less problems now.  Neurological: Negative.   Hematological: Negative.   Psychiatric/Behavioral: Negative.          Objective:   Physical Exam  Constitutional: She is oriented to person, place, and time. She appears well-developed and well-nourished. No distress.  HENT:  Head: Normocephalic and atraumatic.  Right Ear: External ear normal.  Left Ear: External ear normal.  Mouth/Throat: Oropharynx is clear and moist. No oropharyngeal exudate.  Eyes: Conjunctivae and EOM are normal. Pupils are equal, round, and reactive to light. Left eye exhibits no discharge. No scleral icterus.  Neck: Neck supple. No JVD present. No thyromegaly present.  Cardiovascular: Normal rate, regular rhythm, normal heart sounds and intact distal pulses.   No murmur heard. Pulmonary/Chest: Effort normal and breath sounds normal. No respiratory distress. She has no wheezes. She has no rales. She exhibits no tenderness.  Breasts normal female  Abdominal: Soft. Bowel sounds are normal. She exhibits no distension and no mass. There is no tenderness. There is no rebound and no guarding.  Genitourinary:  Deferred to Dr. Ambrose MantleHenley  Musculoskeletal: Normal range of motion. She exhibits no edema.  Lymphadenopathy:    She has no cervical adenopathy.  Neurological: She is alert and oriented to person, place, and time. She has normal reflexes. No cranial nerve deficit. Coordination normal.  Skin: Skin is warm and dry. No rash noted. She is not diaphoretic.  Psychiatric: She has a normal mood and affect. Her behavior is normal. Judgment and thought content normal.  Vitals reviewed.         Assessment & Plan:  Normal health maintenance exam  History of interstitial cystitis  History of palpitations-not recently an issue  Plan: Return in one year or as needed. She looks great and exercises regularly.

## 2014-03-24 ENCOUNTER — Encounter: Payer: Self-pay | Admitting: Internal Medicine

## 2014-11-17 ENCOUNTER — Other Ambulatory Visit: Payer: Self-pay | Admitting: Internal Medicine

## 2014-11-24 ENCOUNTER — Ambulatory Visit (INDEPENDENT_AMBULATORY_CARE_PROVIDER_SITE_OTHER): Payer: BLUE CROSS/BLUE SHIELD | Admitting: Internal Medicine

## 2014-11-24 ENCOUNTER — Encounter: Payer: Self-pay | Admitting: Internal Medicine

## 2014-11-24 VITALS — BP 102/62 | HR 68 | Temp 97.9°F | Ht 64.0 in | Wt 105.5 lb

## 2014-11-24 DIAGNOSIS — N3010 Interstitial cystitis (chronic) without hematuria: Secondary | ICD-10-CM | POA: Diagnosis not present

## 2014-11-24 DIAGNOSIS — Z0000 Encounter for general adult medical examination without abnormal findings: Secondary | ICD-10-CM | POA: Diagnosis not present

## 2014-11-24 MED ORDER — VALACYCLOVIR HCL 500 MG PO TABS: ORAL_TABLET | ORAL | Status: DC

## 2014-11-25 NOTE — Progress Notes (Signed)
   Subjective:    Patient ID: Jennifer Reyes, female    DOB: [birth year], 59 y.o.   MRN: [id number]  HPI 59 year old White Female 59 here today for health maintenance exam and evaluation of medical issues. 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 She is concerned about her GFR. Wonders if she has chronic kidney disease. It may be she was fasting and if she repeats it well hydrated she will get a different value. She has no history of diabetes or hypertension. She has a history of interstitial cystitis treated by urologist for many years. Evaluated for palpitations in 1991. History of left frozen shoulder and right frozen shoulder. Had tonsillectomy 1961. Pilonidal cyst 1974. Multiple urethral dilatations. D&C for miscarriage 1995. Anal fissure repair with Clorpactin treatment January 1994. Hydrodilatation of bladder and cystoscopy August 1997.  She is allergic to penicillin-causes hives and rash. She is allergic to bee stings.  Gets annual influenza immunization through employment.  Social history: She is married. Has one adult son. Husband has a physiatry wrist. Patient is a nonsmoker. Does not consume alcohol. 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.  Family history: Father with history of coronary artery bypass surgery and hypertension. Mother in good health. 3 sisters, one of them has Mnire's disease. Grandmother with history of Mnire's disease.  Patient had colonoscopy 2015 by Dr. Kinnie Scales.    Review of Systems  Constitutional: Negative.   All other systems reviewed and are negative.      Objective:   Physical Exam  Constitutional: She is oriented to person, place, and time. She appears well-developed and well-nourished. No distress.  HENT:  Head: Normocephalic and atraumatic.  Right Ear: External ear normal.  Left Ear: External ear normal.  Mouth/Throat: Oropharynx is clear and moist. No  oropharyngeal exudate.  Eyes: Conjunctivae and EOM are normal. Pupils are equal, round, and reactive to light. Right eye exhibits no discharge. Left eye exhibits no discharge. No scleral icterus.  Neck: Neck supple. No JVD present. No thyromegaly present.  Cardiovascular: Normal rate, regular rhythm, normal heart sounds and intact distal pulses.   No murmur heard. Pulmonary/Chest: Effort normal and breath sounds normal.  Breasts normal female  Abdominal: Bowel sounds are normal. She exhibits no distension and no mass. There is no tenderness. There is no rebound and no guarding.  Genitourinary:  GYN is Dr. Ambrose Mantle.  Musculoskeletal: She exhibits no edema.  Lymphadenopathy:    She has no cervical adenopathy.  Neurological: She is alert and oriented to person, place, and time. She has normal reflexes. No cranial nerve deficit. Coordination normal.  Skin: Skin is warm and dry. No rash noted. She is not diaphoretic.  Psychiatric: She has a normal mood and affect. Her behavior is normal. Judgment and thought content normal.  Vitals reviewed.         Assessment & Plan:   History of interstitial cystitis  History of palpitations  Decreased GFR-was repeated and result was similar. Discussed with Dr.Webb, nephrologist, who feels that this is not indicative of any renal pathology. Patient has no hypertension or diabetes. Feels that it is just a calculation/estimate  and not to worry. Explained to patient.   She repeated her GFR well hydrated and says that result was 56 mL/m. She is concerned about chronic kidney disease.  Creatinine is normal at 1.08. See comment above.

## 2015-02-24 NOTE — Patient Instructions (Signed)
It was a pleasure to see you today. Return in one year or as needed. 

## 2015-04-05 ENCOUNTER — Encounter: Payer: Self-pay | Attending: Internal Medicine | Admitting: Skilled Nursing Facility1

## 2015-04-05 NOTE — Progress Notes (Signed)
Pt arrived for the appointment but at check in realized her current insurance did not cover the visit. 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 Pt then declined the appointment for this reason.

## 2016-02-20 ENCOUNTER — Other Ambulatory Visit: Payer: Self-pay | Admitting: Internal Medicine

## 2016-03-07 ENCOUNTER — Encounter: Payer: Self-pay | Admitting: Internal Medicine

## 2016-03-07 ENCOUNTER — Ambulatory Visit (INDEPENDENT_AMBULATORY_CARE_PROVIDER_SITE_OTHER): Payer: 59 | Admitting: Internal Medicine

## 2016-03-07 VITALS — BP 110/74 | HR 68 | Temp 98.5°F | Ht 64.0 in | Wt 107.0 lb

## 2016-03-07 DIAGNOSIS — H6501 Acute serous otitis media, right ear: Secondary | ICD-10-CM | POA: Diagnosis not present

## 2016-03-07 DIAGNOSIS — J209 Acute bronchitis, unspecified: Secondary | ICD-10-CM

## 2016-03-07 DIAGNOSIS — J0100 Acute maxillary sinusitis, unspecified: Secondary | ICD-10-CM | POA: Diagnosis not present

## 2016-03-07 DIAGNOSIS — H6692 Otitis media, unspecified, left ear: Secondary | ICD-10-CM

## 2016-03-07 MED ORDER — LEVOFLOXACIN 500 MG PO TABS: 500.0000 mg | ORAL_TABLET | Freq: Every day | ORAL | 0 refills | Status: DC

## 2016-03-07 NOTE — Patient Instructions (Signed)
Levaquin 500 milligrams daily for 10 days. Rest and drink plenty of fluids. 

## 2016-03-07 NOTE — Progress Notes (Signed)
   Subjective:    Patient ID: Jennifer Reyes, female    DOB: 10/06/1955, 61 y.o.   MRN: [id number]  HPI 61 year old Female who has a protracted respiratory infection for nearly 2 weeks. Has had cream-colored nasal discharge and sputum production. Low-grade fever. Has malaise and fatigue. Would seem to get better and then would have relapse. Recently left ear was hurting. Has had scratchy throat. Sounds nasally congested.  History of interstitial cystitis seen by Dr. Isabel CapriceGrapey. In the Fall had 2 Klebsiella urinary tract infections.    Review of Systems see above     Objective:   Physical Exam Left TM is red and full. Right TM is full but not red. Pharynx is clear. Neck is supple. Chest clear to auscultation without rales or wheezing or rhonchi       Assessment & Plan:  Acute bronchitis  Acute left otitis media  Acute right serous otitis media  Acute maxillary sinusitis  Plan: Levaquin 500 milligrams daily for 10 days. Rest and drink plenty of fluids.

## 2016-04-01 ENCOUNTER — Telehealth: Payer: Self-pay

## 2016-04-01 ENCOUNTER — Other Ambulatory Visit: Payer: Self-pay

## 2016-04-01 MED ORDER — LEVOFLOXACIN 500 MG PO TABS: 500.0000 mg | ORAL_TABLET | Freq: Every day | ORAL | 0 refills | Status: DC

## 2016-04-01 NOTE — Telephone Encounter (Signed)
Sent!

## 2016-04-01 NOTE — Telephone Encounter (Signed)
Please refill Levaquin and let her know we did as she asked.

## 2016-04-01 NOTE — Telephone Encounter (Signed)
Pt called states she was here a few weeks ago for ear pain. Levaquin was prescribed, she still c/o ear fullness and "ear popping", cough and congestion for a week now. I offered her an appt for tomorrow and she said "can you renew her prescription" instead.

## 2016-04-01 NOTE — Telephone Encounter (Signed)
Spoke with patient @ [phone] to advise that Dr. Lenord FellersBaxley is refilling Levaquin.  Will send to [facility] to e-scribe Rx.  [facility], will you please e-scribe Levaquin?  Thank you.

## 2016-04-15 ENCOUNTER — Ambulatory Visit: Payer: 59 | Admitting: Internal Medicine

## 2016-04-25 ENCOUNTER — Ambulatory Visit (INDEPENDENT_AMBULATORY_CARE_PROVIDER_SITE_OTHER): Payer: 59 | Admitting: Internal Medicine

## 2016-04-25 ENCOUNTER — Encounter: Payer: Self-pay | Admitting: Internal Medicine

## 2016-04-25 VITALS — BP 120/78 | HR 76 | Temp 98.5°F | Ht 64.0 in | Wt 106.0 lb

## 2016-04-25 DIAGNOSIS — J22 Unspecified acute lower respiratory infection: Secondary | ICD-10-CM

## 2016-04-25 DIAGNOSIS — R059 Cough, unspecified: Secondary | ICD-10-CM

## 2016-04-25 DIAGNOSIS — R05 Cough: Secondary | ICD-10-CM | POA: Diagnosis not present

## 2016-04-25 MED ORDER — AZITHROMYCIN 250 MG PO TABS: ORAL_TABLET | ORAL | 0 refills | Status: DC

## 2016-04-25 MED ORDER — HYDROCODONE-HOMATROPINE 5-1.5 MG/5ML PO SYRP: 5.0000 mL | ORAL_SOLUTION | Freq: Three times a day (TID) | ORAL | 0 refills | Status: DC | PRN

## 2016-04-25 MED ORDER — METHYLPREDNISOLONE ACETATE 80 MG/ML IJ SUSP
80.0000 mg | Freq: Once | INTRAMUSCULAR | Status: AC
  Administered 2016-04-25: 80 mg via INTRAMUSCULAR

## 2016-05-08 NOTE — Patient Instructions (Signed)
Zithromax Z-PAK take 2 tablets day 1 then 1 tablet days 2 through 5. Hycodan 1 teaspoon by mouth every 8 hours when necessary cough. Rest and drink plenty of fluids.

## 2016-05-08 NOTE — Progress Notes (Signed)
   Subjective:    Patient ID: Jennifer Reyes, female    DOB: 10/07/1955, 61 y.o.   MRN: [id number]  HPI 61 year old Female was here in January 11 complaining of protracted respiratory infection for nearly 2 weeks with low-grade fever. At that time had cream colored nasal discharge and sputum production. Left ear had been hurting at that time. She had a scratchy throat. Was treated with Levaquin for 10 days after being diagnosed with acute bronchitis, acute left otitis media, acute right serous otitis media and acute maxillary sinusitis. Medication was refilled once at her request in early February.  Patient says she has had some left pleuritic type pain and doesn't feel that she ever got 100% better. Has been extremely tired.    Review of Systems see above     Objective:   Physical Exam TMs are clear. Pharynx is clear. Neck is supple. Chest is clear to auscultation without significant rales or wheezing       Assessment & Plan:  Protracted bronchitis-status post possible pneumonia in mid February  Plan: We discussed getting an x-ray but her chest is clear. Prescribe Zithromax Z-PAK take 2 tablets day one followed by 1 tablet days 2 through 5. Hycodan 1 teaspoon by mouth every 8 hours when necessary cough. Rest and drink plenty of fluids.

## 2016-07-23 DIAGNOSIS — R8271 Bacteriuria: Secondary | ICD-10-CM | POA: Diagnosis not present

## 2016-07-23 DIAGNOSIS — B961 Klebsiella pneumoniae [K. pneumoniae] as the cause of diseases classified elsewhere: Secondary | ICD-10-CM | POA: Diagnosis not present

## 2016-09-11 DIAGNOSIS — Z1231 Encounter for screening mammogram for malignant neoplasm of breast: Secondary | ICD-10-CM | POA: Diagnosis not present

## 2016-09-11 DIAGNOSIS — Z124 Encounter for screening for malignant neoplasm of cervix: Secondary | ICD-10-CM | POA: Diagnosis not present

## 2016-09-11 DIAGNOSIS — N762 Acute vulvitis: Secondary | ICD-10-CM | POA: Diagnosis not present

## 2016-09-11 DIAGNOSIS — Z01419 Encounter for gynecological examination (general) (routine) without abnormal findings: Secondary | ICD-10-CM | POA: Diagnosis not present

## 2016-09-11 DIAGNOSIS — N941 Unspecified dyspareunia: Secondary | ICD-10-CM | POA: Diagnosis not present

## 2016-09-11 DIAGNOSIS — Z1151 Encounter for screening for human papillomavirus (HPV): Secondary | ICD-10-CM | POA: Diagnosis not present

## 2016-09-11 DIAGNOSIS — Z1389 Encounter for screening for other disorder: Secondary | ICD-10-CM | POA: Diagnosis not present

## 2016-09-11 DIAGNOSIS — Z130 Encounter for screening for diseases of the blood and blood-forming organs and certain disorders involving the immune mechanism: Secondary | ICD-10-CM | POA: Diagnosis not present

## 2016-09-11 LAB — HM MAMMOGRAPHY

## 2016-10-15 DIAGNOSIS — N3020 Other chronic cystitis without hematuria: Secondary | ICD-10-CM | POA: Diagnosis not present

## 2016-10-15 DIAGNOSIS — N390 Urinary tract infection, site not specified: Secondary | ICD-10-CM | POA: Diagnosis not present

## 2016-10-15 DIAGNOSIS — R300 Dysuria: Secondary | ICD-10-CM | POA: Diagnosis not present

## 2016-10-15 DIAGNOSIS — B961 Klebsiella pneumoniae [K. pneumoniae] as the cause of diseases classified elsewhere: Secondary | ICD-10-CM | POA: Diagnosis not present

## 2016-12-09 ENCOUNTER — Telehealth: Payer: Self-pay

## 2016-12-09 MED ORDER — CLONAZEPAM 0.5 MG PO TABS: 0.5000 mg | ORAL_TABLET | Freq: Every evening | ORAL | 5 refills | Status: DC | PRN

## 2016-12-09 NOTE — Telephone Encounter (Signed)
Called in and pt is aware 

## 2016-12-09 NOTE — Telephone Encounter (Signed)
Pt is on Klonopin 0.5mg  for her interstitial cystitis, and her urologist is retired and her new urologist will not fill this medication for her. She would like to know if you could take this over for her. She has been on this for over 10 years and is not sure what to do but the urologist asked her to call her PCP to take this over.

## 2016-12-09 NOTE — Telephone Encounter (Signed)
Please refill x 6 months 

## 2017-03-31 DIAGNOSIS — N3010 Interstitial cystitis (chronic) without hematuria: Secondary | ICD-10-CM | POA: Diagnosis not present

## 2017-03-31 DIAGNOSIS — R102 Pelvic and perineal pain: Secondary | ICD-10-CM | POA: Diagnosis not present

## 2017-05-06 ENCOUNTER — Other Ambulatory Visit: Payer: Self-pay | Admitting: Internal Medicine

## 2017-05-06 NOTE — Telephone Encounter (Signed)
Refill x 1 year 

## 2017-07-01 DIAGNOSIS — N3010 Interstitial cystitis (chronic) without hematuria: Secondary | ICD-10-CM | POA: Diagnosis not present

## 2017-07-22 ENCOUNTER — Other Ambulatory Visit: Payer: Self-pay | Admitting: Internal Medicine

## 2017-07-22 DIAGNOSIS — E7800 Pure hypercholesterolemia, unspecified: Secondary | ICD-10-CM

## 2017-07-22 DIAGNOSIS — Z1329 Encounter for screening for other suspected endocrine disorder: Secondary | ICD-10-CM

## 2017-07-22 DIAGNOSIS — Z1321 Encounter for screening for nutritional disorder: Secondary | ICD-10-CM

## 2017-07-22 DIAGNOSIS — R7302 Impaired glucose tolerance (oral): Secondary | ICD-10-CM

## 2017-07-22 DIAGNOSIS — Z0000 Encounter for general adult medical examination without abnormal findings: Secondary | ICD-10-CM

## 2017-07-29 ENCOUNTER — Other Ambulatory Visit: Payer: BLUE CROSS/BLUE SHIELD | Admitting: Internal Medicine

## 2017-07-29 DIAGNOSIS — Z0000 Encounter for general adult medical examination without abnormal findings: Secondary | ICD-10-CM

## 2017-07-29 DIAGNOSIS — Z1329 Encounter for screening for other suspected endocrine disorder: Secondary | ICD-10-CM

## 2017-07-29 DIAGNOSIS — N3010 Interstitial cystitis (chronic) without hematuria: Secondary | ICD-10-CM | POA: Diagnosis not present

## 2017-07-29 DIAGNOSIS — M8580 Other specified disorders of bone density and structure, unspecified site: Secondary | ICD-10-CM | POA: Diagnosis not present

## 2017-07-29 DIAGNOSIS — R7302 Impaired glucose tolerance (oral): Secondary | ICD-10-CM

## 2017-07-29 DIAGNOSIS — Z1321 Encounter for screening for nutritional disorder: Secondary | ICD-10-CM

## 2017-07-29 DIAGNOSIS — E7800 Pure hypercholesterolemia, unspecified: Secondary | ICD-10-CM

## 2017-07-30 LAB — CBC WITH DIFFERENTIAL/PLATELET
BASOS PCT: 0.9 %
Basophils Absolute: 51 cells/uL (ref 0–200)
EOS ABS: 211 {cells}/uL (ref 15–500)
Eosinophils Relative: 3.7 %
HCT: 40 % (ref 35.0–45.0)
HEMOGLOBIN: 13.5 g/dL (ref 11.7–15.5)
Lymphs Abs: 1379 cells/uL (ref 850–3900)
MCH: 32.2 pg (ref 27.0–33.0)
MCHC: 33.8 g/dL (ref 32.0–36.0)
MCV: 95.5 fL (ref 80.0–100.0)
MPV: 9.9 fL (ref 7.5–12.5)
Monocytes Relative: 8.4 %
NEUTROS ABS: 3580 {cells}/uL (ref 1500–7800)
Neutrophils Relative %: 62.8 %
Platelets: 239 10*3/uL (ref 140–400)
RBC: 4.19 10*6/uL (ref 3.80–5.10)
RDW: 11.9 % (ref 11.0–15.0)
Total Lymphocyte: 24.2 %
WBC: 5.7 10*3/uL (ref 3.8–10.8)
WBCMIX: 479 {cells}/uL (ref 200–950)

## 2017-07-30 LAB — TSH: TSH: 1.37 mIU/L (ref 0.40–4.50)

## 2017-07-30 LAB — COMPLETE METABOLIC PANEL WITH GFR
AG Ratio: 2.1 (calc) (ref 1.0–2.5)
ALT: 17 U/L (ref 6–29)
AST: 20 U/L (ref 10–35)
Albumin: 4.4 g/dL (ref 3.6–5.1)
Alkaline phosphatase (APISO): 44 U/L (ref 33–130)
BUN/Creatinine Ratio: 17 (calc) (ref 6–22)
BUN: 17 mg/dL (ref 7–25)
CO2: 30 mmol/L (ref 20–32)
Calcium: 9.5 mg/dL (ref 8.6–10.4)
Chloride: 105 mmol/L (ref 98–110)
Creat: 1.01 mg/dL — ABNORMAL HIGH (ref 0.50–0.99)
GFR, Est African American: 69 mL/min/{1.73_m2} (ref >=60)
GFR, Est Non African American: 59 mL/min/{1.73_m2} — ABNORMAL LOW (ref >=60)
GLUCOSE: 84 mg/dL (ref 65–99)
Globulin: 2.1 g/dL (calc) (ref 1.9–3.7)
POTASSIUM: 4.5 mmol/L (ref 3.5–5.3)
Sodium: 141 mmol/L (ref 135–146)
Total Bilirubin: 0.5 mg/dL (ref 0.2–1.2)
Total Protein: 6.5 g/dL (ref 6.1–8.1)

## 2017-07-30 LAB — LIPID PANEL
CHOL/HDL RATIO: 3.1 (calc) (ref <5.0)
Cholesterol: 200 mg/dL — ABNORMAL HIGH (ref <200)
HDL: 65 mg/dL (ref >50)
LDL Cholesterol (Calc): 116 mg/dL (calc) — ABNORMAL HIGH
Non-HDL Cholesterol (Calc): 135 mg/dL (calc) — ABNORMAL HIGH (ref <130)
Triglycerides: 87 mg/dL (ref <150)

## 2017-07-30 LAB — HEMOGLOBIN A1C
EAG (MMOL/L): 5.8 (calc)
Hgb A1c MFr Bld: 5.3 % of total Hgb (ref <5.7)
Mean Plasma Glucose: 105 (calc)

## 2017-07-30 LAB — VITAMIN D 25 HYDROXY (VIT D DEFICIENCY, FRACTURES): Vit D, 25-Hydroxy: 48 ng/mL (ref 30–100)

## 2017-08-05 ENCOUNTER — Ambulatory Visit (INDEPENDENT_AMBULATORY_CARE_PROVIDER_SITE_OTHER): Payer: BLUE CROSS/BLUE SHIELD | Admitting: Internal Medicine

## 2017-08-05 ENCOUNTER — Encounter: Payer: Self-pay | Admitting: Internal Medicine

## 2017-08-05 VITALS — BP 100/70 | HR 65 | Ht 63.5 in | Wt 106.0 lb

## 2017-08-05 DIAGNOSIS — E7800 Pure hypercholesterolemia, unspecified: Secondary | ICD-10-CM | POA: Diagnosis not present

## 2017-08-05 DIAGNOSIS — Z0000 Encounter for general adult medical examination without abnormal findings: Secondary | ICD-10-CM

## 2017-08-05 DIAGNOSIS — M8580 Other specified disorders of bone density and structure, unspecified site: Secondary | ICD-10-CM | POA: Diagnosis not present

## 2017-08-05 DIAGNOSIS — N3010 Interstitial cystitis (chronic) without hematuria: Secondary | ICD-10-CM

## 2017-08-05 LAB — POCT URINALYSIS DIPSTICK
APPEARANCE: NORMAL
Bilirubin, UA: NEGATIVE
Blood, UA: NEGATIVE
Glucose, UA: NEGATIVE
KETONES UA: NEGATIVE
LEUKOCYTES UA: NEGATIVE
Nitrite, UA: NEGATIVE
Odor: NORMAL
PH UA: 6 (ref 5.0–8.0)
Protein, UA: NEGATIVE
Spec Grav, UA: 1.01 (ref 1.010–1.025)
UROBILINOGEN UA: 0.2 U/dL

## 2017-08-05 MED ORDER — VALACYCLOVIR HCL 500 MG PO TABS: ORAL_TABLET | ORAL | 3 refills | Status: DC

## 2017-08-05 NOTE — Progress Notes (Signed)
   Subjective:    Patient ID: Jennifer Reyes, female    DOB: 05/20/1955, 62 y.o.   MRN: [id number]  HPI 62 year old White Female for health maintenance exam and evaluation of medical issues.  She retired recently from the [employer].  She and her husband have purchased a condominium near [place], [place] and will be spending some time there.  They also have a home in [place].  History of interstitial cystitis treated by Dr. Logan BoresEvans.  Evaluated for palpitations in 1991.  History of left frozen shoulder and right frozen shoulder.  Tonsillectomy 1961.  Pilonidal cyst 1974.  Multiple urethral dilatations.  D&C for miscarriage 1995.  Anal fissure repair with Clorpactin treatment January 1994.  Hydrodilatation of bladder and cystoscopy August 1997.  She is allergic to bee stings.  She is allergic to penicillin-causes hives and a rash.  Social history: She is married.  One adult son who is married.  Husband is a Biochemist, clinicalhysiatrist but has retired.  She has a BS degree in Tree surgeonmedical Technology.  She is a non-smoker.  Family history: Father passed away recently with complications of heart disease.  He was status post coronary artery bypass surgery and had a history of hypertension.  Mother with COPD and poor health.  3 sisters, 1 of whom has Mnire's disease.  Grandmother with history of Mnire's disease.  Patient had colonoscopy 2015 by Dr. Kinnie ScalesMedoff.    Review of Systems     Objective:   Physical Exam  Constitutional: She is oriented to person, place, and time. She appears well-developed and well-nourished. No distress.  HENT:  Head: Normocephalic and atraumatic.  Right Ear: External ear normal.  Left Ear: External ear normal.  Mouth/Throat: Oropharynx is clear and moist. No oropharyngeal exudate.  Eyes: Conjunctivae and EOM are normal. Right eye exhibits no discharge. Left eye exhibits no discharge.  Neck: Neck supple. No JVD present. No thyromegaly present.  Cardiovascular: Normal  rate, regular rhythm and normal heart sounds. Exam reveals no gallop and no friction rub.  No murmur heard. Breast without masses  Pulmonary/Chest: Breath sounds normal. No respiratory distress. She has no wheezes. She has no rales.  Abdominal: Soft. Bowel sounds are normal. She exhibits no distension and no mass. There is no tenderness. There is no rebound and no guarding. No hernia.  Genitourinary:  Genitourinary Comments: Deferred to Dr. Ambrose MantleHenley   Musculoskeletal: She exhibits no edema or deformity.  Lymphadenopathy:    She has no cervical adenopathy.  Neurological: She is alert and oriented to person, place, and time. She displays normal reflexes. No cranial nerve deficit. Coordination normal.  Skin: Skin is warm and dry. She is not diaphoretic.  Psychiatric: She has a normal mood and affect. Her behavior is normal. Judgment and thought content normal.  Vitals reviewed.         Assessment & Plan:  Normal health maintenance exam  History of interstitial cystitis seen by Dr. Logan BoresEvans  History of palpitations  Mildly elevated LDL of 116 with an HDL of 65.  In 2015 LDL was 104.  She will watch her diet.  In 2014 LDL was normal.  History of HSV type II treated with daily valacyclovir.  History of anxiety treated with Klonopin.  Osteopenia-bone density study shows T score in the right femoral neck -1.6 and -1.1 in the AP spine.  Continue to monitor and follow-up in 2 years.  Plan: Return in 1 year or as needed.  Watch diet.  Weight is good.

## 2017-08-12 ENCOUNTER — Ambulatory Visit (INDEPENDENT_AMBULATORY_CARE_PROVIDER_SITE_OTHER): Payer: BLUE CROSS/BLUE SHIELD

## 2017-08-12 DIAGNOSIS — M8588 Other specified disorders of bone density and structure, other site: Secondary | ICD-10-CM

## 2017-08-12 DIAGNOSIS — M85852 Other specified disorders of bone density and structure, left thigh: Secondary | ICD-10-CM

## 2017-08-12 DIAGNOSIS — M8589 Other specified disorders of bone density and structure, multiple sites: Secondary | ICD-10-CM | POA: Diagnosis not present

## 2017-08-12 DIAGNOSIS — Z780 Asymptomatic menopausal state: Secondary | ICD-10-CM | POA: Diagnosis not present

## 2017-08-12 DIAGNOSIS — M8580 Other specified disorders of bone density and structure, unspecified site: Secondary | ICD-10-CM

## 2017-08-24 NOTE — Patient Instructions (Signed)
It was a pleasure to see you today.  Watch diet a bit.  Return in 1 year or as needed.  Continue same medications.

## 2017-09-05 ENCOUNTER — Encounter: Payer: Self-pay | Admitting: Internal Medicine

## 2017-09-05 ENCOUNTER — Telehealth: Payer: Self-pay | Admitting: Internal Medicine

## 2017-09-05 MED ORDER — ACYCLOVIR 5 % EX OINT: TOPICAL_OINTMENT | CUTANEOUS | 1 refills | Status: DC

## 2017-09-05 NOTE — Telephone Encounter (Signed)
Called Jennifer Reyes back to let her know we have called in Zovirax Ointment. She said Thank You

## 2017-09-05 NOTE — Telephone Encounter (Addendum)
Jennifer Reyes Self [phone]  [facility] [place]  Valtrex Ointment  Jennifer Reyes called to say she is having a flare up and would like for some Valtrex ointment to be called in. She stated she had used the ointment several years ago and she would like to have it and continue using the tablet as well.  There is no Valtrex ointment available. Have called in Zovirax ointment

## 2017-09-09 ENCOUNTER — Other Ambulatory Visit: Payer: BLUE CROSS/BLUE SHIELD

## 2017-09-19 ENCOUNTER — Encounter: Payer: Self-pay | Admitting: Internal Medicine

## 2017-09-19 DIAGNOSIS — Z1389 Encounter for screening for other disorder: Secondary | ICD-10-CM | POA: Diagnosis not present

## 2017-09-19 DIAGNOSIS — Z124 Encounter for screening for malignant neoplasm of cervix: Secondary | ICD-10-CM | POA: Diagnosis not present

## 2017-09-19 DIAGNOSIS — Z01419 Encounter for gynecological examination (general) (routine) without abnormal findings: Secondary | ICD-10-CM | POA: Diagnosis not present

## 2017-09-19 DIAGNOSIS — Z130 Encounter for screening for diseases of the blood and blood-forming organs and certain disorders involving the immune mechanism: Secondary | ICD-10-CM | POA: Diagnosis not present

## 2017-09-19 DIAGNOSIS — Z1231 Encounter for screening mammogram for malignant neoplasm of breast: Secondary | ICD-10-CM | POA: Diagnosis not present

## 2017-09-19 DIAGNOSIS — L900 Lichen sclerosus et atrophicus: Secondary | ICD-10-CM | POA: Diagnosis not present

## 2017-09-19 LAB — HM PAP SMEAR

## 2017-10-02 DIAGNOSIS — N3010 Interstitial cystitis (chronic) without hematuria: Secondary | ICD-10-CM | POA: Diagnosis not present

## 2017-12-31 ENCOUNTER — Telehealth: Payer: Self-pay | Admitting: Internal Medicine

## 2017-12-31 ENCOUNTER — Ambulatory Visit (INDEPENDENT_AMBULATORY_CARE_PROVIDER_SITE_OTHER): Payer: BLUE CROSS/BLUE SHIELD | Admitting: Internal Medicine

## 2017-12-31 DIAGNOSIS — Z23 Encounter for immunization: Secondary | ICD-10-CM | POA: Diagnosis not present

## 2017-12-31 MED ORDER — LEVOFLOXACIN 500 MG PO TABS: 500.0000 mg | ORAL_TABLET | Freq: Every day | ORAL | 0 refills | Status: DC

## 2017-12-31 NOTE — Telephone Encounter (Signed)
Sent!

## 2017-12-31 NOTE — Telephone Encounter (Signed)
Patient had appointment to be seen on 01/01/18 @ 3:45 by Dr. Lenord Fellers for ear drainag and ear pain x 3-4 days.  No fever, no sore throat, no congestion to speak of.  Patient showed up today instead while Dr. Lenord Fellers was not in the office.  Apologized for the confusion, but patient understood and said she got the days mixed up.    Spoke with Dr. Lenord Fellers and she looked back in the patients record and saw where she used Levaquin 500mg  daily x 10 days in the past for this same condition.    Called patient and she was ok with [place] calling in the medication for her to [facility].  Patient is grateful and appreciated the call.    Verbal order by Dr. Lenord Fellers to send Levaquin 500mg  daily, dispense #10, no refill.   Pharmacy:  [facility]  Thank you.

## 2017-12-31 NOTE — Patient Instructions (Signed)
Patient received a flu vaccine IM L deltoid, AV, CMA  

## 2018-01-01 ENCOUNTER — Ambulatory Visit: Payer: BLUE CROSS/BLUE SHIELD | Admitting: Internal Medicine

## 2018-01-06 DIAGNOSIS — N3010 Interstitial cystitis (chronic) without hematuria: Secondary | ICD-10-CM | POA: Diagnosis not present

## 2018-03-31 DIAGNOSIS — N3010 Interstitial cystitis (chronic) without hematuria: Secondary | ICD-10-CM | POA: Diagnosis not present

## 2018-06-23 DIAGNOSIS — N3010 Interstitial cystitis (chronic) without hematuria: Secondary | ICD-10-CM | POA: Diagnosis not present

## 2018-07-02 ENCOUNTER — Telehealth: Payer: Self-pay | Admitting: Internal Medicine

## 2018-07-02 NOTE — Telephone Encounter (Signed)
[name] [phone]  [name] at [address] [place].  Phone [phone]  Jennifer Reyes called to say that she had moved and they are now using the above pharmacy for all prescriptions.

## 2018-09-22 DIAGNOSIS — N3010 Interstitial cystitis (chronic) without hematuria: Secondary | ICD-10-CM | POA: Diagnosis not present

## 2018-11-30 ENCOUNTER — Telehealth: Payer: Self-pay | Admitting: Internal Medicine

## 2018-11-30 MED ORDER — VALACYCLOVIR HCL 500 MG PO TABS: ORAL_TABLET | ORAL | 2 refills | Status: DC

## 2018-11-30 MED ORDER — ACYCLOVIR 5 % EX OINT: TOPICAL_OINTMENT | CUTANEOUS | 1 refills | Status: DC

## 2018-11-30 NOTE — Telephone Encounter (Signed)
Jennifer Reyes [phone]  New Pharmacy * [facility] in [place]   acyclovir ointment (ZOVIRAX) 5 %    valACYclovir (VALTREX) 500 MG tablet  [name] called to say she needs new prescriptions for above medication sent into her new pharmacy because they have now moved to [place].  I also scheduled her for her CPE and Labs in October, she would like to get labs done at the [facility] in [place] lab if possible, that would be more convenient.

## 2018-11-30 NOTE — Telephone Encounter (Signed)
She needs to come here and get her labs. We use [facility] and they may be [facility]. It needs to be entered through here and coded correctly by Araceli.  Meds refilled as requested.

## 2018-12-22 ENCOUNTER — Other Ambulatory Visit: Payer: Self-pay

## 2018-12-22 ENCOUNTER — Other Ambulatory Visit: Payer: BC Managed Care – PPO | Admitting: Internal Medicine

## 2018-12-22 DIAGNOSIS — M8580 Other specified disorders of bone density and structure, unspecified site: Secondary | ICD-10-CM

## 2018-12-22 DIAGNOSIS — R7302 Impaired glucose tolerance (oral): Secondary | ICD-10-CM | POA: Diagnosis not present

## 2018-12-22 DIAGNOSIS — Z0000 Encounter for general adult medical examination without abnormal findings: Secondary | ICD-10-CM

## 2018-12-22 DIAGNOSIS — E7800 Pure hypercholesterolemia, unspecified: Secondary | ICD-10-CM | POA: Diagnosis not present

## 2018-12-22 DIAGNOSIS — Z1321 Encounter for screening for nutritional disorder: Secondary | ICD-10-CM

## 2018-12-22 NOTE — Addendum Note (Signed)
Addended by: Mady Haagensen on: 12/22/2018 09:37 AM   Modules accepted: Orders

## 2018-12-23 LAB — LIPID PANEL
Cholesterol: 210 mg/dL — ABNORMAL HIGH (ref <200)
HDL: 68 mg/dL (ref >=50)
LDL Cholesterol (Calc): 120 mg/dL (calc) — ABNORMAL HIGH
Non-HDL Cholesterol (Calc): 142 mg/dL (calc) — ABNORMAL HIGH (ref <130)
Total CHOL/HDL Ratio: 3.1 (calc) (ref <5.0)
Triglycerides: 113 mg/dL (ref <150)

## 2018-12-23 LAB — CBC WITH DIFFERENTIAL/PLATELET
Absolute Monocytes: 539 cells/uL (ref 200–950)
Basophils Absolute: 63 cells/uL (ref 0–200)
Basophils Relative: 0.9 %
Eosinophils Absolute: 168 cells/uL (ref 15–500)
Eosinophils Relative: 2.4 %
HCT: 40.2 % (ref 35.0–45.0)
Hemoglobin: 13.5 g/dL (ref 11.7–15.5)
Lymphs Abs: 1449 cells/uL (ref 850–3900)
MCH: 32.8 pg (ref 27.0–33.0)
MCHC: 33.6 g/dL (ref 32.0–36.0)
MCV: 97.6 fL (ref 80.0–100.0)
MPV: 10 fL (ref 7.5–12.5)
Monocytes Relative: 7.7 %
Neutro Abs: 4781 cells/uL (ref 1500–7800)
Neutrophils Relative %: 68.3 %
Platelets: 268 10*3/uL (ref 140–400)
RBC: 4.12 10*6/uL (ref 3.80–5.10)
RDW: 12.6 % (ref 11.0–15.0)
Total Lymphocyte: 20.7 %
WBC: 7 10*3/uL (ref 3.8–10.8)

## 2018-12-23 LAB — COMPLETE METABOLIC PANEL WITH GFR
AG Ratio: 2.2 (calc) (ref 1.0–2.5)
ALT: 16 U/L (ref 6–29)
AST: 18 U/L (ref 10–35)
Albumin: 4.6 g/dL (ref 3.6–5.1)
Alkaline phosphatase (APISO): 45 U/L (ref 37–153)
BUN/Creatinine Ratio: 20 (calc) (ref 6–22)
BUN: 21 mg/dL (ref 7–25)
CO2: 27 mmol/L (ref 20–32)
Calcium: 9.8 mg/dL (ref 8.6–10.4)
Chloride: 104 mmol/L (ref 98–110)
Creat: 1.03 mg/dL — ABNORMAL HIGH (ref 0.50–0.99)
GFR, Est African American: 67 mL/min/{1.73_m2} (ref >=60)
GFR, Est Non African American: 57 mL/min/{1.73_m2} — ABNORMAL LOW (ref >=60)
Globulin: 2.1 g/dL (calc) (ref 1.9–3.7)
Glucose, Bld: 89 mg/dL (ref 65–99)
Potassium: 4.3 mmol/L (ref 3.5–5.3)
Sodium: 141 mmol/L (ref 135–146)
Total Bilirubin: 0.6 mg/dL (ref 0.2–1.2)
Total Protein: 6.7 g/dL (ref 6.1–8.1)

## 2018-12-23 LAB — HEMOGLOBIN A1C
Hgb A1c MFr Bld: 5.4 % of total Hgb (ref <5.7)
Mean Plasma Glucose: 108 (calc)
eAG (mmol/L): 6 (calc)

## 2018-12-23 LAB — TSH: TSH: 1.69 mIU/L (ref 0.40–4.50)

## 2018-12-24 DIAGNOSIS — N3010 Interstitial cystitis (chronic) without hematuria: Secondary | ICD-10-CM | POA: Diagnosis not present

## 2018-12-25 ENCOUNTER — Encounter: Payer: Self-pay | Admitting: Internal Medicine

## 2018-12-25 ENCOUNTER — Other Ambulatory Visit: Payer: Self-pay

## 2018-12-25 ENCOUNTER — Ambulatory Visit (INDEPENDENT_AMBULATORY_CARE_PROVIDER_SITE_OTHER): Payer: BC Managed Care – PPO | Admitting: Internal Medicine

## 2018-12-25 VITALS — BP 110/80 | HR 60 | Ht 63.5 in | Wt 108.0 lb

## 2018-12-25 DIAGNOSIS — Z1389 Encounter for screening for other disorder: Secondary | ICD-10-CM | POA: Diagnosis not present

## 2018-12-25 DIAGNOSIS — E7800 Pure hypercholesterolemia, unspecified: Secondary | ICD-10-CM

## 2018-12-25 DIAGNOSIS — N3010 Interstitial cystitis (chronic) without hematuria: Secondary | ICD-10-CM

## 2018-12-25 DIAGNOSIS — Z0000 Encounter for general adult medical examination without abnormal findings: Secondary | ICD-10-CM | POA: Diagnosis not present

## 2018-12-25 DIAGNOSIS — H6592 Unspecified nonsuppurative otitis media, left ear: Secondary | ICD-10-CM

## 2018-12-25 DIAGNOSIS — M8580 Other specified disorders of bone density and structure, unspecified site: Secondary | ICD-10-CM | POA: Diagnosis not present

## 2018-12-25 DIAGNOSIS — E669 Obesity, unspecified: Secondary | ICD-10-CM | POA: Diagnosis not present

## 2018-12-25 DIAGNOSIS — Z1231 Encounter for screening mammogram for malignant neoplasm of breast: Secondary | ICD-10-CM | POA: Diagnosis not present

## 2018-12-25 DIAGNOSIS — Z130 Encounter for screening for diseases of the blood and blood-forming organs and certain disorders involving the immune mechanism: Secondary | ICD-10-CM | POA: Diagnosis not present

## 2018-12-25 DIAGNOSIS — Z01419 Encounter for gynecological examination (general) (routine) without abnormal findings: Secondary | ICD-10-CM | POA: Diagnosis not present

## 2018-12-25 LAB — POCT URINALYSIS DIPSTICK
Appearance: NEGATIVE
Bilirubin, UA: NEGATIVE
Blood, UA: NEGATIVE
Glucose, UA: NEGATIVE
Ketones, UA: NEGATIVE
Leukocytes, UA: NEGATIVE
Nitrite, UA: NEGATIVE
Odor: NEGATIVE
Protein, UA: NEGATIVE
Spec Grav, UA: 1.015 (ref 1.010–1.025)
Urobilinogen, UA: 0.2 E.U./dL
pH, UA: 6.5 (ref 5.0–8.0)

## 2018-12-25 MED ORDER — METHYLPREDNISOLONE ACETATE 80 MG/ML IJ SUSP
80.0000 mg | Freq: Once | INTRAMUSCULAR | Status: AC
  Administered 2018-12-25: 80 mg via INTRAMUSCULAR

## 2018-12-25 NOTE — Progress Notes (Signed)
Subjective:    Patient ID: Jennifer Reyes, female    DOB: 06-28-1955, 63 y.o.   MRN: [id number]  HPI Pleasant 63 year old Female for health maintenance exam and evaluation of medical issues.  She and her husband are living in [place] but also have a [place] home in [place].  Expecting twin grandchildren soon.   History of interstitial cystitis treated by Dr. Amalia Hailey.  Evaluated for palpitations 1991.  History of frozen right shoulder and frozen left shoulder in the past.  Tonsillectomy 1961.  Pilonidal cyst 1974.  Multiple urethral dilatations.  D&C for miscarriage 1995.  Anal fissure repair with Clorpactin treatment January 1994.  Hydro dilatation of bladder and cystoscopy August 1997.  She is allergic to bee stings.  She is allergic to penicillin-it causes hives and a rash.  Social history: She is married.  One adult son who is married.  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  She is a non-smoker.  Patient had colonoscopy in 2015 by Dr. Earlean Shawl.  Family history: Father passed away April 5170 with complications of heart disease.  Mother passed away 12/29/2017 with history of COPD.  Father was status post coronary artery bypass surgery had history of hypertension.    3 sisters, 1 of whom has Mnire's disease.  Grandmother with history of Mnire's disease.      Review of Systems  Constitutional: Negative.   HENT:       Complaining of some left ear pain  Respiratory: Negative.   Cardiovascular: Negative.   Gastrointestinal: Negative.   Neurological: Negative.   Psychiatric/Behavioral: Negative.        Objective:   Physical Exam Vitals signs reviewed.  Constitutional:      Appearance: Normal appearance.  HENT:     Head: Normocephalic and atraumatic.     Right Ear: Tympanic membrane normal.     Ears:     Comments: Left TM full with splayed light reflex    Nose: Nose normal.     Mouth/Throat:     Mouth: Mucous membranes  are moist.     Pharynx: Oropharynx is clear.  Eyes:     General: No scleral icterus.       Right eye: No discharge.        Left eye: No discharge.     Extraocular Movements: Extraocular movements intact.     Conjunctiva/sclera: Conjunctivae normal.     Pupils: Pupils are equal, round, and reactive to light.  Neck:     Musculoskeletal: Neck supple. No neck rigidity.  Cardiovascular:     Pulses: Normal pulses.     Heart sounds: Normal heart sounds. No murmur.  Abdominal:     General: Bowel sounds are normal.     Palpations: Abdomen is soft.  Genitourinary:    Comments: Deferred to Dr. Ulanda Edison and she just saw him today Musculoskeletal:     Right lower leg: No edema.     Left lower leg: No edema.  Lymphadenopathy:     Cervical: No cervical adenopathy.  Skin:    General: Skin is warm and dry.  Neurological:     General: No focal deficit present.     Mental Status: She is alert and oriented to person, place, and time.  Psychiatric:        Mood and Affect: Mood normal.        Behavior: Behavior normal.        Thought Content: Thought content  normal.        Judgment: Judgment normal.           Assessment & Plan:  Left serous otitis media treated today with Depo-Medrol 80 mg IM.  Pure hypercholesterolemia total cholesterol 210, HDL 68, triglycerides 113 and LDL 120.  Continue to work on diet and exercise.  History of interstitial cystitis  History of HSV type II treated with daily valacyclovir  History of anxiety treated with Klonopin  History of osteopenia with bone density study right femoral neck -1.6.  Continue vitamin D supplementation.  Follow-up in 2021.  Plan: Return in 1 year or as needed.

## 2018-12-26 NOTE — Patient Instructions (Addendum)
Serous left otitis media will be treated with an injection of Depo-Medrol.  Watch diet a bit.  Have repeat bone density study 2021.  It was a pleasure to see you today.  Return in 1 year or as needed.

## 2019-06-25 ENCOUNTER — Telehealth: Payer: Self-pay | Admitting: Internal Medicine

## 2019-06-25 MED ORDER — VALACYCLOVIR HCL 500 MG PO TABS: ORAL_TABLET | ORAL | 3 refills | Status: DC

## 2019-06-25 NOTE — Telephone Encounter (Signed)
Refill for one year 

## 2019-06-25 NOTE — Telephone Encounter (Signed)
Eshika Reckart [phone]  Alayshia called to get a refill on below medication and she would like it to be 90 day supply.  valACYclovir (VALTREX) 500 MG tablet  [facility] [address] [facility] [place], [place] - [phone] Phone:  [phone]  Fax:  [phone]

## 2019-12-09 ENCOUNTER — Other Ambulatory Visit: Payer: Medicare Other | Admitting: Internal Medicine

## 2019-12-09 ENCOUNTER — Other Ambulatory Visit: Payer: Self-pay

## 2019-12-09 DIAGNOSIS — Z131 Encounter for screening for diabetes mellitus: Secondary | ICD-10-CM

## 2019-12-09 DIAGNOSIS — Z0000 Encounter for general adult medical examination without abnormal findings: Secondary | ICD-10-CM

## 2019-12-09 DIAGNOSIS — E785 Hyperlipidemia, unspecified: Secondary | ICD-10-CM

## 2019-12-09 DIAGNOSIS — M8580 Other specified disorders of bone density and structure, unspecified site: Secondary | ICD-10-CM

## 2019-12-09 DIAGNOSIS — Z1329 Encounter for screening for other suspected endocrine disorder: Secondary | ICD-10-CM

## 2019-12-09 DIAGNOSIS — E7800 Pure hypercholesterolemia, unspecified: Secondary | ICD-10-CM

## 2019-12-09 NOTE — Addendum Note (Signed)
Addended by: Gregery Na on: 12/09/2019 09:35 AM   Modules accepted: Orders

## 2019-12-10 LAB — CBC WITH DIFFERENTIAL/PLATELET
Absolute Monocytes: 537 cells/uL (ref 200–950)
Basophils Absolute: 61 cells/uL (ref 0–200)
Basophils Relative: 1 %
Eosinophils Absolute: 159 cells/uL (ref 15–500)
Eosinophils Relative: 2.6 %
HCT: 41 % (ref 35.0–45.0)
Hemoglobin: 13.9 g/dL (ref 11.7–15.5)
Lymphs Abs: 1513 cells/uL (ref 850–3900)
MCH: 33.1 pg — ABNORMAL HIGH (ref 27.0–33.0)
MCHC: 33.9 g/dL (ref 32.0–36.0)
MCV: 97.6 fL (ref 80.0–100.0)
MPV: 9.9 fL (ref 7.5–12.5)
Monocytes Relative: 8.8 %
Neutro Abs: 3831 cells/uL (ref 1500–7800)
Neutrophils Relative %: 62.8 %
Platelets: 272 10*3/uL (ref 140–400)
RBC: 4.2 10*6/uL (ref 3.80–5.10)
RDW: 12 % (ref 11.0–15.0)
Total Lymphocyte: 24.8 %
WBC: 6.1 10*3/uL (ref 3.8–10.8)

## 2019-12-10 LAB — COMPLETE METABOLIC PANEL WITH GFR
AG Ratio: 2.1 (calc) (ref 1.0–2.5)
ALT: 12 U/L (ref 6–29)
AST: 16 U/L (ref 10–35)
Albumin: 4.5 g/dL (ref 3.6–5.1)
Alkaline phosphatase (APISO): 51 U/L (ref 37–153)
BUN/Creatinine Ratio: 18 (calc) (ref 6–22)
BUN: 21 mg/dL (ref 7–25)
CO2: 27 mmol/L (ref 20–32)
Calcium: 9.2 mg/dL (ref 8.6–10.4)
Chloride: 103 mmol/L (ref 98–110)
Creat: 1.15 mg/dL — ABNORMAL HIGH (ref 0.50–0.99)
GFR, Est African American: 58 mL/min/{1.73_m2} — ABNORMAL LOW (ref >=60)
GFR, Est Non African American: 50 mL/min/{1.73_m2} — ABNORMAL LOW (ref >=60)
Globulin: 2.1 g/dL (calc) (ref 1.9–3.7)
Glucose, Bld: 84 mg/dL (ref 65–99)
Potassium: 4.7 mmol/L (ref 3.5–5.3)
Sodium: 139 mmol/L (ref 135–146)
Total Bilirubin: 0.6 mg/dL (ref 0.2–1.2)
Total Protein: 6.6 g/dL (ref 6.1–8.1)

## 2019-12-10 LAB — HEMOGLOBIN A1C
Hgb A1c MFr Bld: 5.5 % of total Hgb (ref <5.7)
Mean Plasma Glucose: 111 (calc)
eAG (mmol/L): 6.2 (calc)

## 2019-12-10 LAB — LIPID PANEL
Cholesterol: 197 mg/dL (ref <200)
HDL: 66 mg/dL (ref >=50)
LDL Cholesterol (Calc): 110 mg/dL (calc) — ABNORMAL HIGH
Non-HDL Cholesterol (Calc): 131 mg/dL (calc) — ABNORMAL HIGH (ref <130)
Total CHOL/HDL Ratio: 3 (calc) (ref <5.0)
Triglycerides: 103 mg/dL (ref <150)

## 2019-12-10 LAB — TSH: TSH: 1.52 mIU/L (ref 0.40–4.50)

## 2019-12-21 ENCOUNTER — Other Ambulatory Visit: Payer: BC Managed Care – PPO | Admitting: Internal Medicine

## 2019-12-27 ENCOUNTER — Encounter: Payer: Self-pay | Admitting: Internal Medicine

## 2019-12-27 ENCOUNTER — Other Ambulatory Visit: Payer: Self-pay

## 2019-12-27 ENCOUNTER — Ambulatory Visit (INDEPENDENT_AMBULATORY_CARE_PROVIDER_SITE_OTHER): Payer: Medicare Other | Admitting: Internal Medicine

## 2019-12-27 VITALS — BP 92/70 | HR 78 | Ht 63.0 in | Wt 110.0 lb

## 2019-12-27 DIAGNOSIS — Z0000 Encounter for general adult medical examination without abnormal findings: Secondary | ICD-10-CM | POA: Diagnosis not present

## 2019-12-27 DIAGNOSIS — E7800 Pure hypercholesterolemia, unspecified: Secondary | ICD-10-CM | POA: Diagnosis not present

## 2019-12-27 DIAGNOSIS — N3010 Interstitial cystitis (chronic) without hematuria: Secondary | ICD-10-CM | POA: Diagnosis not present

## 2019-12-27 DIAGNOSIS — M8580 Other specified disorders of bone density and structure, unspecified site: Secondary | ICD-10-CM | POA: Diagnosis not present

## 2019-12-27 DIAGNOSIS — Z23 Encounter for immunization: Secondary | ICD-10-CM

## 2019-12-27 DIAGNOSIS — Z780 Asymptomatic menopausal state: Secondary | ICD-10-CM | POA: Diagnosis not present

## 2019-12-27 LAB — POCT URINALYSIS DIPSTICK
Appearance: NEGATIVE
Bilirubin, UA: NEGATIVE
Blood, UA: NEGATIVE
Glucose, UA: NEGATIVE
Ketones, UA: NEGATIVE
Leukocytes, UA: NEGATIVE
Nitrite, UA: NEGATIVE
Odor: NEGATIVE
Protein, UA: NEGATIVE
Spec Grav, UA: 1.01 (ref 1.010–1.025)
Urobilinogen, UA: 0.2 E.U./dL
pH, UA: 7 (ref 5.0–8.0)

## 2020-01-05 ENCOUNTER — Telehealth: Payer: Self-pay | Admitting: Internal Medicine

## 2020-01-05 NOTE — Telephone Encounter (Signed)
Cardiac CT- he will know

## 2020-01-05 NOTE — Telephone Encounter (Signed)
Called patient and let her know it was a Cardiac CT looking for Coronary Calcium Score. She verbalized understanding

## 2020-01-05 NOTE — Telephone Encounter (Signed)
Jennifer Reyes [phone]  [name] called and wanted to get the name of the test you mentioned in her office visit that she should get for family history of heart disease. She has appointment with Dr Olga Millers in December and wanted to know what it was before she went.

## 2020-01-09 NOTE — Progress Notes (Signed)
Subjective:    Patient ID: Jennifer Reyes, female    DOB: August 08, 1955, 64 y.o.   MRN: [id number]  HPI 64 year old Female seen for Welcome to Medicare physical exam and evaluation of medical issues.  Since last visit, her son and his wife had twins.  She has been spending time with them during the pandemic helping out with the twins.  She and her husband also spent time at their home in [place] and they also have a home in [place].  She has a history of interstitial cystitis treated by Dr. Logan Bores.  Evaluated for palpitations in 06/16/89.  History of frozen right shoulder and frozen left shoulder in the past.  Tonsillectomy 1961.  Pilonidal cyst 1974.  Multiple urethral dilatations.  D&C for miscarriage 1993-06-16.  Anal fissure repair with Clorpactin treatment January 1994.  Hydro dilatation of bladder and cystoscopy August 1997.  She is allergic to bee stings.  She is allergic to penicillin-causes hives and a rash.  Patient had colonoscopy in 2012-06-16 by Dr. Kinnie Scales with 10-year follow-up recommended.  Social history: She is married.  One adult son.  Husband is retired Biochemist, clinical.  She has a BS degree in Tree surgeon.  She is a non-smoker.  Family history: Father passed away 2019-04-22with complications of heart disease.  He was status post coronary artery bypass surgery and had history of hypertension.  Mother passed away 2019/10/22with history of COPD.  3 sisters, 1 of whom has Mnire's disease.  Grandmother with history of Mnire's disease.  History of mild osteopenia with T score -1.6 in the right femoral neck in 06-16-17.  Order placed for bone density study to be done in the near future.   Review of Systems  Constitutional: Negative.   Respiratory: Negative.   Cardiovascular: Negative.   Gastrointestinal: Negative.   Neurological: Negative.   Psychiatric/Behavioral: Negative.        Objective:   Physical Exam Blood pressure 92/70 pulse 78 pulse oximetry 96% weight 110  pounds height 5 feet 3 inches BMI 19.49  Skin warm and dry.  Nodes none.  Left TM slightly full but not red.  Chest is clear to auscultation without rales or wheezing.  Pharynx is clear.  Neck is supple without JVD thyromegaly or carotid bruits.  Breast without masses.  Cardiac exam regular rate and rhythm normal S1 and S2. Abdomen soft nondistended without hepatosplenomegaly masses or tenderness.  Extremities without edema.  Neuro intact without focal deficits.  Affect thought and judgment are within normal limits.  GYN exam deferred to Dr. Ambrose Mantle she will seen this afternoon.     Assessment & Plan:  Family history of coronary artery disease in father-patient is interested in cardiac evaluation with cardiac calcium score and we will make referral  Mild osteopenia continue vitamin D supplementation.  Bone density order placed as last study was done in 06-16-2017  Health maintenance-colonoscopy up-to-date-was done in 16-Jun-2012 with 10-year follow-up recommended  History of interstitial cystitis treated by Dr. Logan Bores  History of HSV type II treated with valcyclovir daily  Mild elevation of LDL-continue to monitor  Plan: Patient will see Cardiologist for coronary calcium screening due to family history.  She has mild elevation of LDL at 110.  She will return in 1 year or as needed.  Have ordered bone density study.  Flu vaccine given.  Subjective:   Patient presents for Medicare Annual/Subsequent preventive examination.  Review Past Medical/Family/Social: See above   Risk Factors  Current  exercise habits: Regular exercise Dietary issues discussed: Low-fat low carbohydrate followed  Cardiac risk factors: Family history in father  Depression Screen  (Note: if answer to either of the following is "Yes", a more complete depression screening is indicated)   Over the past two weeks, have you felt down, depressed or hopeless? No  Over the past two weeks, have you felt little interest or pleasure in  doing things? No Have you lost interest or pleasure in daily life? No Do you often feel hopeless? No Do you cry easily over simple problems? No   Activities of Daily Living  In your present state of health, do you have any difficulty performing the following activities?:   Driving? No  Managing money? No  Feeding yourself? No  Getting from bed to chair? No  Climbing a flight of stairs? No  Preparing food and eating?: No  Bathing or showering? No  Getting dressed: No  Getting to the toilet? No  Using the toilet:No  Moving around from place to place: No  In the past year have you fallen or had a near fall?:No  Are you sexually active?  Yes Do you have more than one partner? No   Hearing Difficulties: No  Do you often ask people to speak up or repeat themselves?  Sometimes Do you experience ringing or noises in your ears?  Yes Do you have difficulty understanding soft or whispered voices?  Yes Do you feel that you have a problem with memory?  Sometimes Do you often misplace items?  Sometimes   Home Safety:  Do you have a smoke alarm at your residence? Yes Do you have grab bars in the bathroom?  Yes Do you have throw rugs in your house?  None   Cognitive Testing  Alert? Yes Normal Appearance?Yes  Oriented to person? Yes Place? Yes  Time? Yes  Recall of three objects? Yes  Can perform simple calculations? Yes  Displays appropriate judgment?Yes  Can read the correct time from a watch face?Yes   List the Names of Other Physician/Practitioners you currently use:  See referral list for the physicians patient is currently seeing.  Dr. Ambrose Mantle, GYN  Dr. Logan Bores, Urology   Review of Systems: See above   Objective:     General appearance: Appears younger than stated age Head: Normocephalic, without obvious abnormality, atraumatic  Eyes: conj clear, EOMi PEERLA  Ears: normal TM's and external ear canals both ears  Nose: Nares normal. Septum midline. Mucosa normal. No  drainage or sinus tenderness.  Throat: lips, mucosa, and tongue normal; teeth and gums normal  Neck: no adenopathy, no carotid bruit, no JVD, supple, symmetrical, trachea midline and thyroid not enlarged, symmetric, no tenderness/mass/nodules  No CVA tenderness.  Lungs: clear to auscultation bilaterally  Breasts: normal appearance, no masses or tenderness Heart: regular rate and rhythm, S1, S2 normal, no murmur, click, rub or gallop  Abdomen: soft, non-tender; bowel sounds normal; no masses, no organomegaly  Musculoskeletal: ROM normal in all joints, no crepitus, no deformity, Normal muscle strengthen. Back  is symmetric, no curvature. Skin: Skin color, texture, turgor normal. No rashes or lesions  Lymph nodes: Cervical, supraclavicular, and axillary nodes normal.  Neurologic: CN 2 -12 Normal, Normal symmetric reflexes. Normal coordination and gait  Psych: Alert & Oriented x 3, Mood  stable.    Assessment:    Annual wellness medicare exam   Plan:    During the course of the visit the patient was educated and counseled about appropriate  screening and preventive services including:   Annual flu vaccine  Has had 2 Moderna COVID-19 vaccines  Tdap up-to-date  Due for pneumococcal 23 at age 36.  Did not want to give 2 vaccines today.  May have Shingrix vaccine in the future.  Has had Zostavax in 2010.     Patient Instructions (the written plan) was given to the patient.  Medicare Attestation  I have personally reviewed:  The patient's medical and social history  Their use of alcohol, tobacco or illicit drugs  Their current medications and supplements  The patient's functional ability including ADLs,fall risks, home safety risks, cognitive, and hearing and visual impairment  Diet and physical activities  Evidence for depression or mood disorders  The patient's weight, height, BMI, and visual acuity have been recorded in the chart. I have made referrals, counseling, and provided  education to the patient based on review of the above and I have provided the patient with a written personalized care plan for preventive services.

## 2020-01-09 NOTE — Patient Instructions (Signed)
It was a pleasure to see you today.  Please have bone density study in the near future.  We have placed referral to cardiology for coronary calcium evaluation.  LDL is very mildly elevated.  Continue diet and exercise regimen.  Flu vaccine given.

## 2020-01-10 ENCOUNTER — Encounter: Payer: Self-pay | Admitting: Internal Medicine

## 2020-01-12 ENCOUNTER — Ambulatory Visit (INDEPENDENT_AMBULATORY_CARE_PROVIDER_SITE_OTHER): Payer: Medicare Other

## 2020-01-12 ENCOUNTER — Other Ambulatory Visit: Payer: Self-pay

## 2020-01-12 DIAGNOSIS — Z780 Asymptomatic menopausal state: Secondary | ICD-10-CM

## 2020-01-12 DIAGNOSIS — M8580 Other specified disorders of bone density and structure, unspecified site: Secondary | ICD-10-CM

## 2020-01-12 DIAGNOSIS — Z13820 Encounter for screening for osteoporosis: Secondary | ICD-10-CM

## 2020-02-02 NOTE — Progress Notes (Signed)
Jennifer Booze, MD Reason for referral-hyperlipidemia and family history of coronary artery disease  HPI: 64 year old female for evaluation of hyperlipidemia and family history of coronary artery disease at request of Marlan Palau, MD.  Lipid panel October 2021 showed total cholesterol 197, HDL 66, triglycerides 103, LDL 110.  TSH 1.52.  Patient denies dyspnea, chest pain, palpitations or syncope.  Cardiology now asked to evaluate.  Current Outpatient Medications  Medication Sig Dispense Refill  . acyclovir ointment (ZOVIRAX) 5 % Apply topically as directed 30 g 1  . bupivacaine (MARCAINE) 0.5 % injection 15 mLs by Other route as needed.    . calcium carbonate (OS-CAL) 600 MG TABS Take 600 mg by mouth every other day.    . cholecalciferol (VITAMIN D) 1000 UNITS tablet Take 1,000 Units by mouth daily.    Marland Kitchen ESTRACE VAGINAL 0.1 MG/GM vaginal cream Place vaginally every evening.     . hydrOXYzine (ATARAX/VISTARIL) 10 MG tablet Take 10 mg by mouth at bedtime as needed.    . lidocaine (XYLOCAINE) 2 % jelly 1 application by Other route as needed.    . lidocaine (XYLOCAINE) 5 % ointment Apply 1 application topically as needed.    Marland Kitchen oxybutynin (DITROPAN) 5 MG tablet Take 5 mg by mouth at bedtime.    Marland Kitchen oxyCODONE-acetaminophen (PERCOCET) 7.5-325 MG tablet Take 1 tablet by mouth every 4 (four) hours as needed for severe pain.    . pentosan polysulfate (ELMIRON) 100 MG capsule Take 150 mg by mouth 2 (two) times daily.     . phenazopyridine (PYRIDIUM) 100 MG tablet Take 100 mg by mouth 3 (three) times daily as needed.    . valACYclovir (VALTREX) 500 MG tablet One po daily for prophylaxis 90 tablet 3   No current facility-administered medications for this visit.    Allergies  Allergen Reactions  . Penicillins Hives  . Levofloxacin Nausea And Vomiting  . Meperidine Nausea And Vomiting     Past Medical History:  Diagnosis Date  . Bladder pain   . Frequency of urination   .  Hyperlipidemia   . Interstitial cystitis   . Nocturia   . PONV (postoperative nausea and vomiting)   . Urgency of urination     Past Surgical History:  Procedure Laterality Date  . CYSTO WITH HYDRODISTENSION  12/23/2011   Procedure: CYSTOSCOPY/HYDRODISTENSION;  Surgeon: Valetta Fuller, MD;  Location: [facility];  Service: Urology;  Laterality: N/A;  30 MIN CYSTO, HYDRODISTENSION, URETHRAL CALIBRATION, INSTILLATION MARCAINEAND PYRIDIUM, INJECTION MARCAINE AND KENALOG   . CYSTO/ HOD/ URETHRAL DILATION / INSTILLATION THERAPY  10-11-2005  . LEFT CLOSED SHOULDER MANIPULATION  04-06-2004   CHRONIC ADHESIVE  CAPSULITIS  . MASS EXCISION Left 06/04/2012   Procedure: EXCISIONAL BIOPSY OF MASS LEFT RING FINGER;  Surgeon: Wyn Forster., MD;  Location: [facility];  Service: Orthopedics;  Laterality: Left;    Social History   Socioeconomic History  . Marital status: Married    Spouse name: Not on file  . Number of children: 1  . Years of education: Not on file  . Highest education level: Not on file  Occupational History    Comment: Retired  Tobacco Use  . Smoking status: Never Smoker  . Smokeless tobacco: Never Used  Substance and Sexual Activity  . Alcohol use: No  . Drug use: No  . Sexual activity: Not on file  Other Topics Concern  . Not on file  Social History Narrative  . Not  on file   Social Determinants of Health   Financial Resource Strain: Not on file  Food Insecurity: Not on file  Transportation Needs: Not on file  Physical Activity: Not on file  Stress: Not on file  Social Connections: Not on file  Intimate Partner Violence: Not on file    Family History  Problem Relation Age of Onset  . Heart disease Father   . Hypertension Father     ROS: Dysuria from interstitial cystitis but no fevers or chills, productive cough, hemoptysis, dysphasia, odynophagia, melena, hematochezia, dysuria, hematuria, rash, seizure activity,  orthopnea, PND, pedal edema, claudication. Remaining systems are negative.  Physical Exam:   Blood pressure 115/70, pulse 70, height 5\' 3"  (1.6 m), weight 111 lb 8 oz (50.6 kg), SpO2 (!) 9 %.  General:  Well developed/well nourished in NAD Skin warm/dry Patient not depressed No peripheral clubbing Back-normal HEENT-normal/normal eyelids Neck supple/normal carotid upstroke bilaterally; no bruits; no JVD; no thyromegaly chest - CTA/ normal expansion CV - RRR/normal S1 and S2; no murmurs, rubs or gallops;  PMI nondisplaced Abdomen -NT/ND, no HSM, no mass, + bowel sounds, no bruit 2+ femoral pulses, no bruits Ext-no edema, chords, 2+ DP Neuro-grossly nonfocal  ECG -sinus rhythm, incomplete right bundle branch block, inferolateral T wave inversion.  Personally reviewed  A/P  1 family history of coronary artery disease-we will arrange a calcium score for risk stratification.  2 hyperlipidemia-continue diet.  If significant calcium noted in coronaries would suggest statin but patient states she would likely not be agreeable to this.  We can consider a different medication such as Zetia if calcium score elevated and patient willing.  , MD

## 2020-02-09 ENCOUNTER — Ambulatory Visit (INDEPENDENT_AMBULATORY_CARE_PROVIDER_SITE_OTHER): Payer: Medicare Other | Admitting: Cardiology

## 2020-02-09 ENCOUNTER — Encounter: Payer: Self-pay | Admitting: Cardiology

## 2020-02-09 ENCOUNTER — Other Ambulatory Visit: Payer: Self-pay

## 2020-02-09 VITALS — BP 115/70 | HR 70 | Ht 63.0 in | Wt 111.5 lb

## 2020-02-09 DIAGNOSIS — E7800 Pure hypercholesterolemia, unspecified: Secondary | ICD-10-CM

## 2020-02-09 DIAGNOSIS — Z136 Encounter for screening for cardiovascular disorders: Secondary | ICD-10-CM | POA: Diagnosis not present

## 2020-02-09 NOTE — Patient Instructions (Signed)
  Testing/Procedures:   CORONARY CALCIUM SCORING CT AT [address] [place]   Follow-Up: At [facility], you and your health needs are our priority.  As part of our continuing mission to provide you with exceptional heart care, we have created designated Provider Care Teams.  These Care Teams include your primary Cardiologist (physician) and Advanced Practice Providers (APPs -  Physician Assistants and Nurse Practitioners) who all work together to provide you with the care you need, when you need it.  We recommend signing up for the patient portal called "MyChart".  Sign up information is provided on this After Visit Summary.  MyChart is used to connect with patients for Virtual Visits (Telemedicine).  Patients are able to view lab/test results, encounter notes, upcoming appointments, etc.  Non-urgent messages can be sent to your provider as well.   To learn more about what you can do with MyChart, go to [email or web address].    Your next appointment:    AS NEEDED

## 2020-03-13 ENCOUNTER — Inpatient Hospital Stay: Admission: RE | Admit: 2020-03-13 | Payer: Medicare Other | Source: Ambulatory Visit

## 2020-03-20 ENCOUNTER — Inpatient Hospital Stay: Admission: RE | Admit: 2020-03-20 | Payer: BLUE CROSS/BLUE SHIELD | Source: Ambulatory Visit

## 2020-03-22 ENCOUNTER — Ambulatory Visit (INDEPENDENT_AMBULATORY_CARE_PROVIDER_SITE_OTHER)
Admission: RE | Admit: 2020-03-22 | Discharge: 2020-03-22 | Disposition: A | Discharge status: Home / Self Care | Payer: Self-pay | Source: Ambulatory Visit | Attending: Cardiology | Admitting: Cardiology

## 2020-03-22 ENCOUNTER — Telehealth: Payer: Self-pay | Admitting: Cardiology

## 2020-03-22 ENCOUNTER — Other Ambulatory Visit: Payer: Self-pay

## 2020-03-22 DIAGNOSIS — Z136 Encounter for screening for cardiovascular disorders: Secondary | ICD-10-CM

## 2020-03-22 NOTE — Telephone Encounter (Signed)
Will need fu noncontrast chest CT six months; Ca score pending Jennifer Reyes

## 2020-03-22 NOTE — Telephone Encounter (Signed)
Diane with [facility] called to give the results of the patient's Cardiac Score. She wanted to make sure that the ordering provider has been made aware.  IMPRESSION: Nodular thickening at the RIGHT lung base. Non-contrast chest CT at 6-12 months is recommended. If the nodule is stable at time of repeat CT, then future CT at 18-24 months (from today's scan) is considered optional for low-risk patients, but is recommended for high-risk patients.

## 2020-03-24 NOTE — Telephone Encounter (Signed)
Results released to mychart

## 2020-08-22 ENCOUNTER — Encounter: Payer: Self-pay | Admitting: *Deleted

## 2020-08-24 ENCOUNTER — Telehealth: Payer: Self-pay | Admitting: Internal Medicine

## 2020-08-24 ENCOUNTER — Other Ambulatory Visit: Payer: Self-pay

## 2020-08-24 ENCOUNTER — Encounter: Payer: Self-pay | Admitting: Internal Medicine

## 2020-08-24 ENCOUNTER — Telehealth (INDEPENDENT_AMBULATORY_CARE_PROVIDER_SITE_OTHER): Payer: Medicare Other | Admitting: Internal Medicine

## 2020-08-24 VITALS — Temp 98.1°F

## 2020-08-24 DIAGNOSIS — H6692 Otitis media, unspecified, left ear: Secondary | ICD-10-CM

## 2020-08-24 DIAGNOSIS — J0100 Acute maxillary sinusitis, unspecified: Secondary | ICD-10-CM | POA: Diagnosis not present

## 2020-08-24 MED ORDER — AZITHROMYCIN 250 MG PO TABS: ORAL_TABLET | ORAL | 0 refills | Status: AC

## 2020-08-24 MED ORDER — HYDROCODONE BIT-HOMATROP MBR 5-1.5 MG/5ML PO SOLN: 5.0000 mL | Freq: Three times a day (TID) | ORAL | 0 refills | Status: DC | PRN

## 2020-08-24 NOTE — Telephone Encounter (Signed)
Jennifer Reyes [phone]  [place], about 2 and half weeks ago her husband tested positive for COVID, she only tested herself once 2 weeks ago and it was negative, few days after that she came down with something. She is still having tenderness in her face to touch, blowing out yellowish stuff, left ear hurts, having coughing spells. She is going to do home COVID test and call me back.

## 2020-08-24 NOTE — Progress Notes (Signed)
   Subjective:    Patient ID: SHANTARA GOOSBY, female    DOB: [birth year], 65 y.o.   MRN: [id number]  HPI 65 year old Female seen today via interactive audio and video telecommunications due to the Coronavirus pandemic.  Patient is agreeable to this format today.  She is at her home in [place] and I am at our office.  We received a phone call from patient today and her husband had tested positive for COVID some 2-1/2 weeks ago.  She tested herself around that time and was negative for COVID-19.  A few days later she came down with respiratory infection and is still having protracted axillary sinus tenderness.  She has discolored nasal drainage in her left ear hurts.  She is also having some cough.  Patient is allergic to Penicillin- it causes hives and a rash.  She has a history of interstitial cystitis treated by Dr. Logan Bores.  History of tonsillectomy 1961.  She has history of mild osteopenia.  T score in November 2021 was -1.8.  Review of Systems denies fever, shaking chills, vomiting     Objective:   Physical Exam  Patient seen virtually.  Looks a bit pale and fatigued.      Assessment & Plan:  Acute maxillary sinusitis based on history obtained today  Left otitis media based on history obtained today  Possible COVID-19 virus infection but she thus far has tested negative although her husband tested positive some 2-1/2 weeks ago.  She has been careful and has no known COVID-19 exposure other than through her husband  Plan: Since we do not have a positive COVID test, I do not think we should give her Paxlovid.  I think a better choice would be Zithromax Z-PAK and prednisone in tapering course during with 6 10 mg tablets and decrease by 1 tablet daily i.e. 6-5-4-3-2-1.  May take Hycodan 1 teaspoon every 8 hours as needed for cough and ear pain.  Rest and drink fluids.  Monitor vital signs including pulse oximetry.  Call if symptoms worsen.

## 2020-08-24 NOTE — Telephone Encounter (Signed)
scheduled

## 2020-08-31 ENCOUNTER — Telehealth: Payer: Self-pay | Admitting: Internal Medicine

## 2020-08-31 MED ORDER — PREDNISONE 10 MG PO TABS: ORAL_TABLET | ORAL | 0 refills | Status: DC

## 2020-08-31 NOTE — Telephone Encounter (Signed)
Jennifer Reyes [phone]  Javiana called to say she thought she was going to get 3 prescriptions the other day, she got 2 of them, she did not receive the steroid. She is much better than she was, she is still having some left ear, and left side sinus and stuffiness.

## 2020-08-31 NOTE — Telephone Encounter (Signed)
Prednisone was sent, patient notified.

## 2020-09-12 ENCOUNTER — Other Ambulatory Visit: Payer: Self-pay | Admitting: Internal Medicine

## 2020-09-17 NOTE — Patient Instructions (Signed)
Take Zithromax Z-PAK 2 tabs day 1 followed by 1 tab days 2 through 5.  Hycodan 1 teaspoon every 8 hours as needed for cough and sore throat pain.  Tapering course of prednisone starting with 6 tablets day 1 (10 mg tablets) and decrease by 1 tablet daily i.e. 6-5-4-3-2-1 taper.  Quarantine at home.  Retest perhaps in the next day or 2.  Rest and drink plenty of fluids.  Call if symptoms worsen.  Monitor pulse oximetry.

## 2020-09-25 ENCOUNTER — Telehealth: Payer: Self-pay | Admitting: Cardiology

## 2020-09-25 DIAGNOSIS — R918 Other nonspecific abnormal finding of lung field: Secondary | ICD-10-CM

## 2020-09-25 NOTE — Telephone Encounter (Signed)
Pt is requesting a f/u CT Scoring test. Pt states that she was supposed to have a f/u CT Scoring Test  from the one performed in 2021 Please advise pt further

## 2020-09-25 NOTE — Telephone Encounter (Signed)
Spoke with pt, she is due for a CT scan wo to follow up on lung nodule. She is scheduled for 10/16/20 @ 10 am in the imaging department in the [facility].

## 2020-10-16 ENCOUNTER — Other Ambulatory Visit: Payer: Self-pay

## 2020-10-16 ENCOUNTER — Ambulatory Visit (INDEPENDENT_AMBULATORY_CARE_PROVIDER_SITE_OTHER): Payer: Medicare Other

## 2020-10-16 DIAGNOSIS — R918 Other nonspecific abnormal finding of lung field: Secondary | ICD-10-CM

## 2020-10-17 ENCOUNTER — Telehealth: Payer: Self-pay | Admitting: Internal Medicine

## 2020-10-17 ENCOUNTER — Telehealth: Payer: Self-pay | Admitting: *Deleted

## 2020-10-17 NOTE — Telephone Encounter (Signed)
Spoke with pt, she has discussed the results with dr baxley her medical doctor and they have placed the referral for [facility]. Patient will let us know what she finds out.

## 2020-10-17 NOTE — Telephone Encounter (Signed)
Patient recently had CT calcium score and score was 0. Over read showed nodular thickening right lung base. Had CT chest showing multiple bilateral pulmonary nodules. She improved with Z-pak in June when she developed cough and never tested positive for Covid. Spoke with her by phone today. Husband had Covid in June.  I think best to refer her to Pulmonologist. She is agreeable to this . Spoke with her by phone today.

## 2020-10-17 NOTE — Telephone Encounter (Addendum)
-----   Message from Lewayne Bunting, MD sent at 10/16/2020  4:30 PM EDT ----- Please arrange appointment with pulmonary for abnormal chest CT. Olga Millers, MD   Left message for pt to call

## 2020-11-06 ENCOUNTER — Telehealth: Payer: Self-pay | Admitting: Cardiology

## 2020-11-06 NOTE — Telephone Encounter (Signed)
FYI, pt sent message to scheduling stating - "Please be advised that per Dr. Ludwig Clarks recommendation I have scheduled a consult visit with Dr. Tonia Brooms, pulmonologist,  on November 16, 2020."

## 2020-11-16 ENCOUNTER — Other Ambulatory Visit: Payer: Self-pay

## 2020-11-16 ENCOUNTER — Ambulatory Visit (INDEPENDENT_AMBULATORY_CARE_PROVIDER_SITE_OTHER): Payer: Medicare Other | Admitting: Pulmonary Disease

## 2020-11-16 ENCOUNTER — Encounter: Payer: Self-pay | Admitting: Pulmonary Disease

## 2020-11-16 VITALS — BP 118/74 | HR 64 | Ht 63.0 in | Wt 114.5 lb

## 2020-11-16 DIAGNOSIS — R918 Other nonspecific abnormal finding of lung field: Secondary | ICD-10-CM

## 2020-11-16 DIAGNOSIS — Z789 Other specified health status: Secondary | ICD-10-CM | POA: Diagnosis not present

## 2020-11-16 NOTE — Patient Instructions (Signed)
Thank you for visiting Dr. Tonia Brooms at [facility]. Today we recommend the following:  Orders Placed This Encounter  Procedures   Procedural/ Surgical Case Request: VIDEO BRONCHOSCOPY WITH ENDOBRONCHIAL NAVIGATION   NM PET Image Initial (PI) Skull Base To Thigh   QuantiFERON-TB Gold Plus   Ambulatory referral to Pulmonology   Bronchoscopy 12/26/2020  Return in about 2 months (around 01/16/2021) for with APP or Dr. Tonia Brooms.    Please do your part to reduce the spread of COVID-19.

## 2020-11-16 NOTE — Progress Notes (Signed)
Synopsis: Referred in September 2022 for lung nodules  by Margaree Mackintosh, MD  Subjective:   PATIENT ID: Jennifer Reyes GENDER: female DOB: [birth year], MRN: [id number]  Chief Complaint  Patient presents with   Consult    Consult for abnormal CT that was done back in August 2022. Denied any symptoms other than a dry cough.     This is a 65 year old female, past medical history of hyperlipidemia.  Lifelong non-smoker, no significant family history for malignancy.  She is up-to-date on colon cancer screening and breast cancer screening.  She had a coronary calcium scoring CT in January which revealed a new right lower lobe nodule.  Ultimately had follow-up CT imaging of the chest with primary care which revealed multiple newer nodular densities within the lower lobe as well as a small area of patchy tree-in-bud.  This has progressed since her calcium scoring CT.  We reviewed CT imaging today in the office.  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  At the [facility] hospital she was definitely exposed to the microbiology side to include NTM and TB.  She has had negative TB testing in the past.  She also worked at the [employer].  Of note she states she has had a nonproductive cough for at least 20+ years.   Past Medical History:  Diagnosis Date   Bladder pain    Frequency of urination    Hyperlipidemia    Interstitial cystitis    Nocturia    PONV (postoperative nausea and vomiting)    Urgency of urination      Family History  Problem Relation Age of Onset   Heart disease Father    Hypertension Father      Past Surgical History:  Procedure Laterality Date   CYSTO WITH HYDRODISTENSION  12/23/2011   Procedure: CYSTOSCOPY/HYDRODISTENSION;  Surgeon: Valetta Fuller, MD;  Location: [facility];  Service: Urology;  Laterality: N/A;  30 MIN CYSTO, HYDRODISTENSION, URETHRAL CALIBRATION, INSTILLATION MARCAINEAND PYRIDIUM, INJECTION MARCAINE AND KENALOG     CYSTO/ HOD/ URETHRAL DILATION / INSTILLATION THERAPY  10-11-2005   LEFT CLOSED SHOULDER MANIPULATION  04-06-2004   CHRONIC ADHESIVE  CAPSULITIS   MASS EXCISION Left 06/04/2012   Procedure: EXCISIONAL BIOPSY OF MASS LEFT RING FINGER;  Surgeon: Wyn Forster., MD;  Location: [facility];  Service: Orthopedics;  Laterality: Left;    Social History   Socioeconomic History   Marital status: Married    Spouse name: Not on file   Number of children: 1   Years of education: Not on file   Highest education level: Not on file  Occupational History    Comment: Retired  Tobacco Use   Smoking status: Never   Smokeless tobacco: Never  Substance and Sexual Activity   Alcohol use: No   Drug use: No   Sexual activity: Not on file  Other Topics Concern   Not on file  Social History Narrative   Not on file   Social Determinants of Health   Financial Resource Strain: Not on file  Food Insecurity: Not on file  Transportation Needs: Not on file  Physical Activity: Not on file  Stress: Not on file  Social Connections: Not on file  Intimate Partner Violence: Not on file     Allergies  Allergen Reactions   Penicillins Hives   Levofloxacin Nausea And Vomiting   Meperidine Nausea And Vomiting   Prednisone  Outpatient Medications Prior to Visit  Medication Sig Dispense Refill   acyclovir ointment (ZOVIRAX) 5 % Apply topically as directed 30 g 1   bupivacaine (MARCAINE) 0.5 % injection 15 mLs by Other route as needed.     calcium carbonate (OS-CAL) 600 MG TABS Take 600 mg by mouth every other day.     cholecalciferol (VITAMIN D) 1000 UNITS tablet Take 1,000 Units by mouth daily.     ESTRACE VAGINAL 0.1 MG/GM vaginal cream Place vaginally every evening.      hydrOXYzine (ATARAX/VISTARIL) 10 MG tablet Take 10 mg by mouth at bedtime as needed.     lidocaine (XYLOCAINE) 5 % ointment Apply 1 application topically as needed.     oxybutynin (DITROPAN) 5 MG tablet Take 5 mg  by mouth at bedtime.     oxyCODONE-acetaminophen (PERCOCET) 7.5-325 MG tablet Take 1 tablet by mouth every 4 (four) hours as needed for severe pain.     pentosan polysulfate (ELMIRON) 100 MG capsule Take 150 mg by mouth 2 (two) times daily.      phenazopyridine (PYRIDIUM) 100 MG tablet Take 100 mg by mouth 3 (three) times daily as needed.     valACYclovir (VALTREX) 500 MG tablet TAKE 1 TABLET BY MOUTH DAILY FOR PROPHYLAXIS 90 tablet 3   HYDROcodone bit-homatropine (HYCODAN) 5-1.5 MG/5ML syrup Take 5 mLs by mouth every 8 (eight) hours as needed for cough. 120 mL 0   predniSONE (DELTASONE) 10 MG tablet TAKE IN TAPERING COURSE 6,5,4,3,2,1 21 tablet 0   No facility-administered medications prior to visit.    Review of Systems  Constitutional:  Negative for chills, fever, malaise/fatigue and weight loss.  HENT:  Negative for hearing loss, sore throat and tinnitus.   Eyes:  Negative for blurred vision and double vision.  Respiratory:  Positive for cough. Negative for hemoptysis, sputum production, shortness of breath, wheezing and stridor.   Cardiovascular:  Negative for chest pain, palpitations, orthopnea, leg swelling and PND.  Gastrointestinal:  Negative for abdominal pain, constipation, diarrhea, heartburn, nausea and vomiting.  Genitourinary:  Negative for dysuria, hematuria and urgency.  Musculoskeletal:  Negative for joint pain and myalgias.  Skin:  Negative for itching and rash.  Neurological:  Negative for dizziness, tingling, weakness and headaches.  Endo/Heme/Allergies:  Negative for environmental allergies. Does not bruise/bleed easily.  Psychiatric/Behavioral:  Negative for depression. The patient is not nervous/anxious and does not have insomnia.   All other systems reviewed and are negative.   Objective:  Physical Exam Vitals reviewed.  Constitutional:      General: She is not in acute distress.    Appearance: She is well-developed.  HENT:     Head: Normocephalic and  atraumatic.  Eyes:     General: No scleral icterus.    Conjunctiva/sclera: Conjunctivae normal.     Pupils: Pupils are equal, round, and reactive to light.  Neck:     Vascular: No JVD.     Trachea: No tracheal deviation.  Cardiovascular:     Rate and Rhythm: Normal rate and regular rhythm.     Heart sounds: Normal heart sounds. No murmur heard. Pulmonary:     Effort: Pulmonary effort is normal. No tachypnea, accessory muscle usage or respiratory distress.     Breath sounds: No stridor. No wheezing, rhonchi or rales.  Abdominal:     General: Bowel sounds are normal. There is no distension.     Palpations: Abdomen is soft.     Tenderness: There is no abdominal tenderness.  Musculoskeletal:  General: No tenderness.     Cervical back: Neck supple.  Lymphadenopathy:     Cervical: No cervical adenopathy.  Skin:    General: Skin is warm and dry.     Capillary Refill: Capillary refill takes less than 2 seconds.     Findings: No rash.  Neurological:     Mental Status: She is alert and oriented to person, place, and time.  Psychiatric:        Behavior: Behavior normal.     Vitals:   11/16/20 0856  BP: 118/74  Pulse: 64  SpO2: 98%  Weight: 114 lb 8 oz (51.9 kg)  Height: 5\' 3"  (1.6 m)   98% on RA BMI Readings from Last 3 Encounters:  11/16/20 20.28 kg/m  02/09/20 19.75 kg/m  12/27/19 19.49 kg/m   Wt Readings from Last 3 Encounters:  11/16/20 114 lb 8 oz (51.9 kg)  02/09/20 111 lb 8 oz (50.6 kg)  12/27/19 110 lb (49.9 kg)     CBC    Component Value Date/Time   WBC 6.1 12/09/2019 0938   RBC 4.20 12/09/2019 0938   HGB 13.9 12/09/2019 0938   HCT 41.0 12/09/2019 0938   PLT 272 12/09/2019 0938   MCV 97.6 12/09/2019 0938   MCH 33.1 (H) 12/09/2019 0938   MCHC 33.9 12/09/2019 0938   RDW 12.0 12/09/2019 0938   LYMPHSABS 1,513 12/09/2019 0938   EOSABS 159 12/09/2019 0938   BASOSABS 61 12/09/2019 0938     Chest Imaging:  CT scan of the chest  10/19/2020: Multiple bilateral pulmonary nodules largest at 13 mm.  These have progressed in size since her CT imaging in January 2022 from cardiac coronary CT imaging.\ We reviewed this today and compared them today in the office. The patient's images have been independently reviewed by me.    Pulmonary Functions Testing Results: No flowsheet data found.  FeNO:   Pathology:   Echocardiogram:   Heart Catheterization:     Assessment & Plan:     ICD-10-CM   1. Lung nodules  R91.8 NM PET Image Initial (PI) Skull Base To Thigh    QuantiFERON-TB Gold Plus    Ambulatory referral to Pulmonology    Procedural/ Surgical Case Request: VIDEO BRONCHOSCOPY WITH ENDOBRONCHIAL NAVIGATION    2. Nonsmoker  Z78.9       Discussion:  65 year old female lifelong non-smoker, incidentally found lung nodules.  They have progressed in size and multitude since January of this past year.  We discussed the probability of underlying etiologies.  I suspect we are dealing with infectious disease disease, possibly nodular Mycobacterium.  Other differential diagnosis could include metastatic disease however I think would be very unusual to see basilar near symmetric bilateral subpleural nodules presenting as metastatic disease.  Also she is asymptomatic with any other issues at the moment. Up-to-date on cancer screenings.  Plan: I think the next best step would be to start with a nuclear medicine PET scan. We will get images just to make sure that we are not missing anything else. If these nodules are hypermetabolic, being possibly infectious I think they will. We will then plan for bronchoscopy tissue biopsy and tissue culture to rule out NTM. She would like to know whether or not we are dealing with a infection as that has shown progress over the past 8 months.  She is going out of town on Friday and will be in Tuesday for at least 3 to 4 weeks.  We will tentatively schedule her  bronchoscopy on  November 1. Hopefully we can get her PET scan done the week before that.  I would like to have the PET scan completed prior to bronchoscopy.     Current Outpatient Medications:    acyclovir ointment (ZOVIRAX) 5 %, Apply topically as directed, Disp: 30 g, Rfl: 1   bupivacaine (MARCAINE) 0.5 % injection, 15 mLs by Other route as needed., Disp: , Rfl:    calcium carbonate (OS-CAL) 600 MG TABS, Take 600 mg by mouth every other day., Disp: , Rfl:    cholecalciferol (VITAMIN D) 1000 UNITS tablet, Take 1,000 Units by mouth daily., Disp: , Rfl:    ESTRACE VAGINAL 0.1 MG/GM vaginal cream, Place vaginally every evening. , Disp: , Rfl:    hydrOXYzine (ATARAX/VISTARIL) 10 MG tablet, Take 10 mg by mouth at bedtime as needed., Disp: , Rfl:    lidocaine (XYLOCAINE) 5 % ointment, Apply 1 application topically as needed., Disp: , Rfl:    oxybutynin (DITROPAN) 5 MG tablet, Take 5 mg by mouth at bedtime., Disp: , Rfl:    oxyCODONE-acetaminophen (PERCOCET) 7.5-325 MG tablet, Take 1 tablet by mouth every 4 (four) hours as needed for severe pain., Disp: , Rfl:    pentosan polysulfate (ELMIRON) 100 MG capsule, Take 150 mg by mouth 2 (two) times daily. , Disp: , Rfl:    phenazopyridine (PYRIDIUM) 100 MG tablet, Take 100 mg by mouth 3 (three) times daily as needed., Disp: , Rfl:    valACYclovir (VALTREX) 500 MG tablet, TAKE 1 TABLET BY MOUTH DAILY FOR PROPHYLAXIS, Disp: 90 tablet, Rfl: 3  I spent 63 minutes dedicated to the care of this patient on the date of this encounter to include pre-visit review of records, face-to-face time with the patient discussing conditions above, post visit ordering of testing, clinical documentation with the electronic health record, making appropriate referrals as documented, and communicating necessary findings to members of the patients care team.   Josephine Igo, DO [facility] 11/16/2020 9:57 AM

## 2020-11-21 LAB — QUANTIFERON-TB GOLD PLUS
Mitogen-NIL: >10 IU/mL
NIL: 0.05 IU/mL
QuantiFERON-TB Gold Plus: NEGATIVE
TB1-NIL: <0 IU/mL
TB2-NIL: 0 IU/mL

## 2020-11-22 ENCOUNTER — Encounter: Payer: Self-pay | Admitting: *Deleted

## 2020-11-22 ENCOUNTER — Institutional Professional Consult (permissible substitution): Payer: Medicare Other | Admitting: Emergency Medicine

## 2020-11-22 NOTE — Progress Notes (Signed)
You can let her know that her quantiferon is negative.   Thanks,  BLI  Jennifer Igo, DO [facility] 11/22/2020 11:09 AM

## 2020-11-30 ENCOUNTER — Other Ambulatory Visit (HOSPITAL_COMMUNITY): Payer: Medicare Other

## 2020-12-19 ENCOUNTER — Encounter (HOSPITAL_COMMUNITY)
Admission: RE | Admit: 2020-12-19 | Discharge: 2020-12-19 | Disposition: A | Discharge status: Home / Self Care | Payer: Medicare Other | Source: Ambulatory Visit | Attending: Pulmonary Disease | Admitting: Pulmonary Disease

## 2020-12-19 ENCOUNTER — Other Ambulatory Visit: Payer: Self-pay

## 2020-12-19 DIAGNOSIS — R918 Other nonspecific abnormal finding of lung field: Secondary | ICD-10-CM | POA: Insufficient documentation

## 2020-12-19 LAB — GLUCOSE, CAPILLARY: Glucose-Capillary: 85 mg/dL (ref 70–99)

## 2020-12-19 MED ORDER — FLUDEOXYGLUCOSE F - 18 (FDG) INJECTION
5.5000 | Freq: Once | INTRAVENOUS | Status: AC | PRN
  Administered 2020-12-19: 5.71 via INTRAVENOUS

## 2020-12-21 ENCOUNTER — Institutional Professional Consult (permissible substitution): Payer: Medicare Other | Admitting: Emergency Medicine

## 2020-12-22 ENCOUNTER — Other Ambulatory Visit: Payer: Self-pay | Admitting: Pulmonary Disease

## 2020-12-22 ENCOUNTER — Ambulatory Visit (INDEPENDENT_AMBULATORY_CARE_PROVIDER_SITE_OTHER): Payer: Medicare Other

## 2020-12-22 ENCOUNTER — Other Ambulatory Visit: Payer: Self-pay

## 2020-12-22 ENCOUNTER — Other Ambulatory Visit: Payer: Medicare Other | Admitting: Internal Medicine

## 2020-12-22 DIAGNOSIS — Z1329 Encounter for screening for other suspected endocrine disorder: Secondary | ICD-10-CM

## 2020-12-22 DIAGNOSIS — Z23 Encounter for immunization: Secondary | ICD-10-CM | POA: Diagnosis not present

## 2020-12-22 DIAGNOSIS — E7800 Pure hypercholesterolemia, unspecified: Secondary | ICD-10-CM

## 2020-12-22 DIAGNOSIS — Z0000 Encounter for general adult medical examination without abnormal findings: Secondary | ICD-10-CM

## 2020-12-22 LAB — COMPLETE METABOLIC PANEL WITH GFR
AG Ratio: 2.1 (calc) (ref 1.0–2.5)
ALT: 14 U/L (ref 6–29)
AST: 17 U/L (ref 10–35)
Albumin: 4.4 g/dL (ref 3.6–5.1)
Alkaline phosphatase (APISO): 51 U/L (ref 37–153)
BUN/Creatinine Ratio: 18 (calc) (ref 6–22)
BUN: 19 mg/dL (ref 7–25)
CO2: 27 mmol/L (ref 20–32)
Calcium: 9.6 mg/dL (ref 8.6–10.4)
Chloride: 106 mmol/L (ref 98–110)
Creat: 1.06 mg/dL — ABNORMAL HIGH (ref 0.50–1.05)
Globulin: 2.1 g/dL (calc) (ref 1.9–3.7)
Glucose, Bld: 86 mg/dL (ref 65–99)
Potassium: 4.3 mmol/L (ref 3.5–5.3)
Sodium: 141 mmol/L (ref 135–146)
Total Bilirubin: 0.5 mg/dL (ref 0.2–1.2)
Total Protein: 6.5 g/dL (ref 6.1–8.1)
eGFR: 58 mL/min/{1.73_m2} — ABNORMAL LOW (ref >=60)

## 2020-12-22 LAB — CBC WITH DIFFERENTIAL/PLATELET
Absolute Monocytes: 567 cells/uL (ref 200–950)
Basophils Absolute: 70 cells/uL (ref 0–200)
Basophils Relative: 1 %
Eosinophils Absolute: 301 cells/uL (ref 15–500)
Eosinophils Relative: 4.3 %
HCT: 40.9 % (ref 35.0–45.0)
Hemoglobin: 13.9 g/dL (ref 11.7–15.5)
Lymphs Abs: 1302 cells/uL (ref 850–3900)
MCH: 32.9 pg (ref 27.0–33.0)
MCHC: 34 g/dL (ref 32.0–36.0)
MCV: 96.9 fL (ref 80.0–100.0)
MPV: 9.9 fL (ref 7.5–12.5)
Monocytes Relative: 8.1 %
Neutro Abs: 4760 cells/uL (ref 1500–7800)
Neutrophils Relative %: 68 %
Platelets: 277 10*3/uL (ref 140–400)
RBC: 4.22 10*6/uL (ref 3.80–5.10)
RDW: 12.4 % (ref 11.0–15.0)
Total Lymphocyte: 18.6 %
WBC: 7 10*3/uL (ref 3.8–10.8)

## 2020-12-22 LAB — LIPID PANEL
Cholesterol: 212 mg/dL — ABNORMAL HIGH (ref <200)
HDL: 69 mg/dL (ref >=50)
LDL Cholesterol (Calc): 123 mg/dL (calc) — ABNORMAL HIGH
Non-HDL Cholesterol (Calc): 143 mg/dL (calc) — ABNORMAL HIGH (ref <130)
Total CHOL/HDL Ratio: 3.1 (calc) (ref <5.0)
Triglycerides: 96 mg/dL (ref <150)

## 2020-12-22 LAB — TSH: TSH: 1.41 mIU/L (ref 0.40–4.50)

## 2020-12-22 LAB — SARS CORONAVIRUS 2 (TAT 6-24 HRS): SARS Coronavirus 2: NEGATIVE

## 2020-12-23 ENCOUNTER — Encounter (HOSPITAL_COMMUNITY): Payer: Self-pay | Admitting: Pulmonary Disease

## 2020-12-23 NOTE — Progress Notes (Signed)
PCP: Marlan Palau Cardiologist: Jens Som No solid food after midnight, clear liquids til 0430, Shower with dial soap night before and day of surgery. Brush teeth prior to coming in. Pt. Verbalized understanding.

## 2020-12-25 ENCOUNTER — Other Ambulatory Visit: Payer: Medicare Other | Admitting: Internal Medicine

## 2020-12-25 NOTE — Anesthesia Preprocedure Evaluation (Addendum)
Anesthesia Evaluation  Patient identified by MRN, date of birth, ID band Patient awake    Reviewed: Allergy & Precautions, H&P , NPO status , Patient's Chart, lab work & pertinent test results  History of Anesthesia Complications (+) PONV and history of anesthetic complications  Airway Mallampati: II  TM Distance: >3 FB Neck ROM: Full    Dental no notable dental hx. (+) Teeth Intact, Dental Advisory Given, Caps   Pulmonary neg pulmonary ROS,    Pulmonary exam normal breath sounds clear to auscultation       Cardiovascular Exercise Tolerance: Good negative cardio ROS Normal cardiovascular exam Rhythm:Regular Rate:Normal     Neuro/Psych negative neurological ROS  negative psych ROS   GI/Hepatic negative GI ROS, Neg liver ROS,   Endo/Other  negative endocrine ROS  Renal/GU negative Renal ROS  negative genitourinary   Musculoskeletal negative musculoskeletal ROS (+)   Abdominal   Peds negative pediatric ROS (+)  Hematology negative hematology ROS (+)   Anesthesia Other Findings   Reproductive/Obstetrics negative OB ROS                            Anesthesia Physical Anesthesia Plan  ASA: 2  Anesthesia Plan: General   Post-op Pain Management:    Induction: Intravenous  PONV Risk Score and Plan: Treatment may vary due to age or medical condition, Ondansetron, Dexamethasone and TIVA  Airway Management Planned: Oral ETT  Additional Equipment: None  Intra-op Plan:   Post-operative Plan: Extubation in OR  Informed Consent: I have reviewed the patients History and Physical, chart, labs and discussed the procedure including the risks, benefits and alternatives for the proposed anesthesia with the patient or authorized representative who has indicated his/her understanding and acceptance.       Plan Discussed with: Anesthesiologist  Anesthesia Plan Comments: (  )       Anesthesia Quick Evaluation

## 2020-12-26 ENCOUNTER — Ambulatory Visit (HOSPITAL_COMMUNITY): Payer: Medicare Other

## 2020-12-26 ENCOUNTER — Encounter (HOSPITAL_COMMUNITY): Payer: Self-pay | Admitting: Pulmonary Disease

## 2020-12-26 ENCOUNTER — Encounter (HOSPITAL_COMMUNITY)
Admission: RE | Disposition: A | Discharge status: Home / Self Care | Payer: Self-pay | Source: Ambulatory Visit | Attending: Pulmonary Disease

## 2020-12-26 ENCOUNTER — Ambulatory Visit (HOSPITAL_COMMUNITY): Payer: Medicare Other | Admitting: Anesthesiology

## 2020-12-26 ENCOUNTER — Ambulatory Visit (HOSPITAL_COMMUNITY)
Admission: RE | Admit: 2020-12-26 | Discharge: 2020-12-26 | Disposition: A | Discharge status: Home / Self Care | Payer: Medicare Other | Source: Ambulatory Visit | Attending: Pulmonary Disease | Admitting: Pulmonary Disease

## 2020-12-26 DIAGNOSIS — Z79899 Other long term (current) drug therapy: Secondary | ICD-10-CM | POA: Diagnosis not present

## 2020-12-26 DIAGNOSIS — Z880 Allergy status to penicillin: Secondary | ICD-10-CM | POA: Insufficient documentation

## 2020-12-26 DIAGNOSIS — Z419 Encounter for procedure for purposes other than remedying health state, unspecified: Secondary | ICD-10-CM

## 2020-12-26 DIAGNOSIS — Z8249 Family history of ischemic heart disease and other diseases of the circulatory system: Secondary | ICD-10-CM | POA: Insufficient documentation

## 2020-12-26 DIAGNOSIS — Z9889 Other specified postprocedural states: Secondary | ICD-10-CM

## 2020-12-26 DIAGNOSIS — R918 Other nonspecific abnormal finding of lung field: Secondary | ICD-10-CM

## 2020-12-26 DIAGNOSIS — E785 Hyperlipidemia, unspecified: Secondary | ICD-10-CM | POA: Diagnosis not present

## 2020-12-26 DIAGNOSIS — Z888 Allergy status to other drugs, medicaments and biological substances status: Secondary | ICD-10-CM | POA: Diagnosis not present

## 2020-12-26 DIAGNOSIS — R911 Solitary pulmonary nodule: Secondary | ICD-10-CM | POA: Diagnosis present

## 2020-12-26 HISTORY — PX: VIDEO BRONCHOSCOPY WITH ENDOBRONCHIAL NAVIGATION: SHX6175

## 2020-12-26 HISTORY — PX: VIDEO BRONCHOSCOPY WITH RADIAL ENDOBRONCHIAL ULTRASOUND: SHX6849

## 2020-12-26 HISTORY — PX: BRONCHIAL BIOPSY: SHX5109

## 2020-12-26 HISTORY — PX: BRONCHIAL NEEDLE ASPIRATION BIOPSY: SHX5106

## 2020-12-26 HISTORY — PX: BRONCHOSCOPY: SUR163

## 2020-12-26 HISTORY — PX: BRONCHIAL BRUSHINGS: SHX5108

## 2020-12-26 HISTORY — PX: BRONCHIAL WASHINGS: SHX5105

## 2020-12-26 SURGERY — VIDEO BRONCHOSCOPY WITH ENDOBRONCHIAL NAVIGATION
Anesthesia: General | Laterality: Bilateral

## 2020-12-26 MED ORDER — FENTANYL CITRATE (PF) 100 MCG/2ML IJ SOLN
INTRAMUSCULAR | Status: DC | PRN
  Administered 2020-12-26 (×2): 50 ug via INTRAVENOUS

## 2020-12-26 MED ORDER — PROPOFOL 10 MG/ML IV BOLUS
INTRAVENOUS | Status: DC | PRN
  Administered 2020-12-26: 150 mg via INTRAVENOUS

## 2020-12-26 MED ORDER — PROPOFOL 500 MG/50ML IV EMUL
INTRAVENOUS | Status: DC | PRN
  Administered 2020-12-26: 50 ug/kg/min via INTRAVENOUS

## 2020-12-26 MED ORDER — DEXAMETHASONE SODIUM PHOSPHATE 4 MG/ML IJ SOLN
INTRAMUSCULAR | Status: DC | PRN
  Administered 2020-12-26: 10 mg via INTRAVENOUS

## 2020-12-26 MED ORDER — PHENYLEPHRINE 40 MCG/ML (10ML) SYRINGE FOR IV PUSH (FOR BLOOD PRESSURE SUPPORT)
PREFILLED_SYRINGE | INTRAVENOUS | Status: DC | PRN
  Administered 2020-12-26 (×2): 80 ug via INTRAVENOUS

## 2020-12-26 MED ORDER — LACTATED RINGERS IV SOLN: INTRAVENOUS | Status: DC

## 2020-12-26 MED ORDER — MIDAZOLAM HCL 5 MG/5ML IJ SOLN
INTRAMUSCULAR | Status: DC | PRN
  Administered 2020-12-26: 2 mg via INTRAVENOUS

## 2020-12-26 MED ORDER — ONDANSETRON HCL 4 MG/2ML IJ SOLN: 4.0000 mg | Freq: Once | INTRAMUSCULAR | Status: DC | PRN

## 2020-12-26 MED ORDER — ROCURONIUM BROMIDE 10 MG/ML (PF) SYRINGE
PREFILLED_SYRINGE | INTRAVENOUS | Status: DC | PRN
  Administered 2020-12-26: 10 mg via INTRAVENOUS
  Administered 2020-12-26: 50 mg via INTRAVENOUS

## 2020-12-26 MED ORDER — CHLORHEXIDINE GLUCONATE 0.12 % MT SOLN
15.0000 mL | OROMUCOSAL | Status: AC
  Administered 2020-12-26: 15 mL via OROMUCOSAL
  Filled 2020-12-26 (×2): qty 15

## 2020-12-26 MED ORDER — SUCCINYLCHOLINE CHLORIDE 200 MG/10ML IV SOSY
PREFILLED_SYRINGE | INTRAVENOUS | Status: DC | PRN
  Administered 2020-12-26: 120 mg via INTRAVENOUS

## 2020-12-26 MED ORDER — MEPERIDINE HCL 100 MG/ML IJ SOLN: 6.2500 mg | INTRAMUSCULAR | Status: DC | PRN

## 2020-12-26 MED ORDER — ACETAMINOPHEN 160 MG/5ML PO SOLN: 325.0000 mg | ORAL | Status: DC | PRN

## 2020-12-26 MED ORDER — OXYCODONE HCL 5 MG PO TABS: 5.0000 mg | ORAL_TABLET | Freq: Once | ORAL | Status: DC | PRN

## 2020-12-26 MED ORDER — PHENYLEPHRINE HCL-NACL 20-0.9 MG/250ML-% IV SOLN
INTRAVENOUS | Status: DC | PRN
  Administered 2020-12-26: 25 ug/min via INTRAVENOUS

## 2020-12-26 MED ORDER — LIDOCAINE HCL (CARDIAC) PF 100 MG/5ML IV SOSY
PREFILLED_SYRINGE | INTRAVENOUS | Status: DC | PRN
  Administered 2020-12-26: 60 mg via INTRAVENOUS

## 2020-12-26 MED ORDER — SUGAMMADEX SODIUM 200 MG/2ML IV SOLN
INTRAVENOUS | Status: DC | PRN
  Administered 2020-12-26: 200 mg via INTRAVENOUS

## 2020-12-26 MED ORDER — FENTANYL CITRATE (PF) 100 MCG/2ML IJ SOLN: 25.0000 ug | INTRAMUSCULAR | Status: DC | PRN

## 2020-12-26 MED ORDER — ONDANSETRON HCL 4 MG/2ML IJ SOLN
INTRAMUSCULAR | Status: DC | PRN
  Administered 2020-12-26: 4 mg via INTRAVENOUS

## 2020-12-26 MED ORDER — ACETAMINOPHEN 325 MG PO TABS: 325.0000 mg | ORAL_TABLET | ORAL | Status: DC | PRN

## 2020-12-26 MED ORDER — OXYCODONE HCL 5 MG/5ML PO SOLN: 5.0000 mg | Freq: Once | ORAL | Status: DC | PRN

## 2020-12-26 SURGICAL SUPPLY — 45 items

## 2020-12-26 NOTE — Op Note (Signed)
Video Bronchoscopy with Robotic Assisted Bronchoscopic Navigation   Date of Operation: 12/26/2020   Pre-op Diagnosis: Right lower lobe pulmonary nodule  Post-op Diagnosis: Right lower lobe pulmonary nodule  Surgeon: Garner Nash, DO   Assistants: None   Anesthesia: General endotracheal anesthesia  Operation: Flexible video fiberoptic bronchoscopy with robotic assistance and biopsies.  Estimated Blood Loss: Minimal  Complications: None  Indications and History: Jennifer Reyes is a 65 y.o. female with history of multiple pulmonary nodules. The risks, benefits, complications, treatment options and expected outcomes were discussed with the patient.  The possibilities of pneumothorax, pneumonia, reaction to medication, pulmonary aspiration, perforation of a viscus, bleeding, failure to diagnose a condition and creating a complication requiring transfusion or operation were discussed with the patient who freely signed the consent.    Description of Procedure: The patient was seen in the Preoperative Area, was examined and was deemed appropriate to proceed.  The patient was taken to [facility] endoscopy room 3, identified as Jennifer Reyes and the procedure verified as Flexible Video Fiberoptic Bronchoscopy.  A Time Out was held and the above information confirmed.   Prior to the date of the procedure a high-resolution CT scan of the chest was performed. Utilizing ION software program a virtual tracheobronchial tree was generated to allow the creation of distinct navigation pathways to the patient's parenchymal abnormalities. After being taken to the operating room general anesthesia was initiated and the patient  was orally intubated. The video fiberoptic bronchoscope was introduced via the endotracheal tube and a general inspection was performed which showed normal right and left lung anatomy, aspiration of the bilateral mainstems was completed to remove any remaining secretions. Robotic catheter  inserted into patient's endotracheal tube.   Target #1 right lower lobe: The distinct navigation pathways prepared prior to this procedure were then utilized to navigate to patient's lesion identified on CT scan. The robotic catheter was secured into place and the vision probe was withdrawn.  Lesion location was approximated using fluoroscopy and radial endobronchial ultrasound for peripheral targeting.  Location of the lesion was confirmed under three-dimensional mobile CBCT imaging.  Registration of the target was updated in system prior to biopsy. Under fluoroscopic guidance transbronchial needle brushings, transbronchial needle biopsies, and transbronchial forceps biopsies were performed to be sent for cytology and pathology. A bronchioalveolar lavage was performed in the right lower lobe and sent for cultures.  At the end of the procedure a general airway inspection was performed and there was no evidence of active bleeding. The bronchoscope was removed.  The patient tolerated the procedure well. There was no significant blood loss and there were no obvious complications. A post-procedural chest x-ray is pending.  Samples Target #1: 1. Transbronchial needle brushings from RLL  2. Transbronchial Wang needle biopsies from RLL 3. Transbronchial forceps biopsies from RLL 4. Bronchoalveolar lavage from RLL  Plans:  The patient will be discharged from the PACU to home when recovered from anesthesia and after chest x-ray is reviewed. We will review the cytology, pathology and microbiology results with the patient when they become available. Outpatient followup will be with Garner Nash, DO.  Garner Nash, DO [facility] 12/26/2020 8:57 AM

## 2020-12-26 NOTE — Transfer of Care (Signed)
Immediate Anesthesia Transfer of Care Note  Patient: ILIYAH BUI  Procedure(s) Performed: VIDEO BRONCHOSCOPY WITH ENDOBRONCHIAL NAVIGATION (Bilateral) VIDEO BRONCHOSCOPY WITH RADIAL ENDOBRONCHIAL ULTRASOUND BRONCHIAL BIOPSIES BRONCHIAL BRUSHINGS BRONCHIAL NEEDLE ASPIRATION BIOPSIES BRONCHIAL WASHINGS  Patient Location: PACU  Anesthesia Type:General  Level of Consciousness: awake  Airway & Oxygen Therapy: Patient Spontanous Breathing and Patient connected to face mask oxygen  Post-op Assessment: Report given to RN and Post -op Vital signs reviewed and stable  Post vital signs: Reviewed and stable  Last Vitals:  Vitals Value Taken Time  BP 101/52 12/26/20 0901  Temp 36.7 C 12/26/20 0900  Pulse 62 12/26/20 0905  Resp 20 12/26/20 0905  SpO2 91 % 12/26/20 0905  Vitals shown include unvalidated device data.  Last Pain:  Vitals:   12/26/20 0900  TempSrc:   PainSc: 0-No pain      Patients Stated Pain Goal: 0 (12/26/20 0604)  Complications: No notable events documented.

## 2020-12-26 NOTE — Anesthesia Procedure Notes (Signed)
Procedure Name: Intubation Date/Time: 12/26/2020 7:38 AM Performed by: Montez Morita, Aalina Brege W, CRNA Pre-anesthesia Checklist: Patient identified, Emergency Drugs available, Suction available and Patient being monitored Patient Re-evaluated:Patient Re-evaluated prior to induction Oxygen Delivery Method: Circle system utilized Preoxygenation: Pre-oxygenation with 100% oxygen Induction Type: IV induction Ventilation: Mask ventilation without difficulty Laryngoscope Size: Miller and 2 Grade View: Grade I Tube type: Oral Tube size: 8.5 mm Number of attempts: 1 Airway Equipment and Method: Stylet Placement Confirmation: ETT inserted through vocal cords under direct vision, positive ETCO2 and breath sounds checked- equal and bilateral Secured at: 23 cm Tube secured with: Tape Dental Injury: Teeth and Oropharynx as per pre-operative assessment

## 2020-12-26 NOTE — Discharge Instructions (Signed)
Flexible Bronchoscopy, Care After This sheet gives you information about how to care for yourself after your test. Your doctor may also give you more specific instructions. If you have problems or questions, contact your doctor. Follow these instructions at home: Eating and drinking Do not eat or drink anything (not even water) for 2 hours after your test, or until your numbing medicine (local anesthetic) wears off. When your numbness is gone and your cough and gag reflexes have come back, you may: Eat only soft foods. Slowly drink liquids. The day after the test, go back to your normal diet. Driving Do not drive for 24 hours if you were given a medicine to help you relax (sedative). Do not drive or use heavy machinery while taking prescription pain medicine. General instructions  Take over-the-counter and prescription medicines only as told by your doctor. Return to your normal activities as told. Ask what activities are safe for you. Do not use any products that have nicotine or tobacco in them. This includes cigarettes and e-cigarettes. If you need help quitting, ask your doctor. Keep all follow-up visits as told by your doctor. This is important. It is very important if you had a tissue sample (biopsy) taken. Get help right away if: You have shortness of breath that gets worse. You get light-headed. You feel like you are going to pass out (faint). You have chest pain. You cough up: More than a little blood. More blood than before. Summary Do not eat or drink anything (not even water) for 2 hours after your test, or until your numbing medicine wears off. Do not use cigarettes. Do not use e-cigarettes. Get help right away if you have chest pain.  This information is not intended to replace advice given to you by your health care provider. Make sure you discuss any questions you have with your health care provider. Document Released: 12/09/2008 Document Revised: 01/24/2017 Document  Reviewed: 03/01/2016 Elsevier Patient Education  2020 [employer].

## 2020-12-26 NOTE — H&P (Signed)
Synopsis: Referred in September 2022 for lung nodules  by Margaree Mackintosh, MD   Subjective:    PATIENT ID: Jennifer Reyes GENDER: female DOB: Feb 10, 1956, MRN: [id number]       Chief Complaint  Patient presents with   Consult      Consult for abnormal CT that was done back in August 2022. Denied any symptoms other than a dry cough.       This is a 65 year old female, past medical history of hyperlipidemia.  Lifelong non-smoker, no significant family history for malignancy.  She is up-to-date on colon cancer screening and breast cancer screening.  She had a coronary calcium scoring CT in January which revealed a new right lower lobe nodule.  Ultimately had follow-up CT imaging of the chest with primary care which revealed multiple newer nodular densities within the lower lobe as well as a small area of patchy tree-in-bud.  This has progressed since her calcium scoring CT.  We reviewed CT imaging today in the office.  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  At the [facility] hospital she was definitely exposed to the microbiology side to include NTM and TB.  She has had negative TB testing in the past.  She also worked at the [employer].  Of note she states she has had a nonproductive cough for at least 20+ years.  12/26/2020: presents to endo for outpatient navigational bronchoscopy          Past Medical History:  Diagnosis Date   Bladder pain     Frequency of urination     Hyperlipidemia     Interstitial cystitis     Nocturia     PONV (postoperative nausea and vomiting)     Urgency of urination             Family History  Problem Relation Age of Onset   Heart disease Father     Hypertension Father             Past Surgical History:  Procedure Laterality Date   CYSTO WITH HYDRODISTENSION   12/23/2011    Procedure: CYSTOSCOPY/HYDRODISTENSION;  Surgeon: Valetta Fuller, MD;  Location: [facility];  Service: Urology;  Laterality: N/A;  30  MIN CYSTO, HYDRODISTENSION, URETHRAL CALIBRATION, INSTILLATION MARCAINEAND PYRIDIUM, INJECTION MARCAINE AND KENALOG     CYSTO/ HOD/ URETHRAL DILATION / INSTILLATION THERAPY   10-11-2005   LEFT CLOSED SHOULDER MANIPULATION   04-06-2004    CHRONIC ADHESIVE  CAPSULITIS   MASS EXCISION Left 06/04/2012    Procedure: EXCISIONAL BIOPSY OF MASS LEFT RING FINGER;  Surgeon: Wyn Forster., MD;  Location: [facility];  Service: Orthopedics;  Laterality: Left;      Social History         Socioeconomic History   Marital status: Married      Spouse name: Not on file   Number of children: 1   Years of education: Not on file   Highest education level: Not on file  Occupational History      Comment: Retired  Tobacco Use   Smoking status: Never   Smokeless tobacco: Never  Substance and Sexual Activity   Alcohol use: No   Drug use: No   Sexual activity: Not on file  Other Topics Concern   Not on file  Social History Narrative   Not on file    Social Determinants of Health    Financial Resource  Strain: Not on file  Food Insecurity: Not on file  Transportation Needs: Not on file  Physical Activity: Not on file  Stress: Not on file  Social Connections: Not on file  Intimate Partner Violence: Not on file          Allergies  Allergen Reactions   Penicillins Hives   Levofloxacin Nausea And Vomiting   Meperidine Nausea And Vomiting   Prednisone              Outpatient Medications Prior to Visit  Medication Sig Dispense Refill   acyclovir ointment (ZOVIRAX) 5 % Apply topically as directed 30 g 1   bupivacaine (MARCAINE) 0.5 % injection 15 mLs by Other route as needed.       calcium carbonate (OS-CAL) 600 MG TABS Take 600 mg by mouth every other day.       cholecalciferol (VITAMIN D) 1000 UNITS tablet Take 1,000 Units by mouth daily.       ESTRACE VAGINAL 0.1 MG/GM vaginal cream Place vaginally every evening.        hydrOXYzine (ATARAX/VISTARIL) 10 MG tablet Take 10  mg by mouth at bedtime as needed.       lidocaine (XYLOCAINE) 5 % ointment Apply 1 application topically as needed.       oxybutynin (DITROPAN) 5 MG tablet Take 5 mg by mouth at bedtime.       oxyCODONE-acetaminophen (PERCOCET) 7.5-325 MG tablet Take 1 tablet by mouth every 4 (four) hours as needed for severe pain.       pentosan polysulfate (ELMIRON) 100 MG capsule Take 150 mg by mouth 2 (two) times daily.        phenazopyridine (PYRIDIUM) 100 MG tablet Take 100 mg by mouth 3 (three) times daily as needed.       valACYclovir (VALTREX) 500 MG tablet TAKE 1 TABLET BY MOUTH DAILY FOR PROPHYLAXIS 90 tablet 3   HYDROcodone bit-homatropine (HYCODAN) 5-1.5 MG/5ML syrup Take 5 mLs by mouth every 8 (eight) hours as needed for cough. 120 mL 0   predniSONE (DELTASONE) 10 MG tablet TAKE IN TAPERING COURSE 6,5,4,3,2,1 21 tablet 0    No facility-administered medications prior to visit.      Review of Systems  Constitutional:  Negative for chills, fever, malaise/fatigue and weight loss.  HENT:  Negative for hearing loss, sore throat and tinnitus.   Eyes:  Negative for blurred vision and double vision.  Respiratory:  Positive for cough. Negative for hemoptysis, sputum production, shortness of breath, wheezing and stridor.   Cardiovascular:  Negative for chest pain, palpitations, orthopnea, leg swelling and PND.  Gastrointestinal:  Negative for abdominal pain, constipation, diarrhea, heartburn, nausea and vomiting.  Genitourinary:  Negative for dysuria, hematuria and urgency.  Musculoskeletal:  Negative for joint pain and myalgias.  Skin:  Negative for itching and rash.  Neurological:  Negative for dizziness, tingling, weakness and headaches.  Endo/Heme/Allergies:  Negative for environmental allergies. Does not bruise/bleed easily.  Psychiatric/Behavioral:  Negative for depression. The patient is not nervous/anxious and does not have insomnia.   All other systems reviewed and are negative.    Objective:    General appearance: 65 y.o., female, NAD, conversant  Eyes: anicteric sclerae, moist conjunctivae; no lid-lag; PERRLA, tracking appropriately HENT: NCAT; oropharynx, MMM, no mucosal ulcerations; normal hard and soft palate Neck: Trachea midline; FROM, supple, lymphadenopathy, no JVD Lungs: CTAB, no crackles, no wheeze, with normal respiratory effort and no intercostal retractions CV: RRR, S1, S2, no MRGs  Abdomen: Soft, non-tender; non-distended,  BS present  Extremities: No peripheral edema, radial and DP pulses present bilaterally  Skin: Normal temperature, turgor and texture; no rash Psych: Appropriate affect Neuro: Alert and oriented to person and place, no focal deficit      BP (!) 121/50   Pulse (!) 56   Temp 97.9 F (36.6 C) (Oral)   Resp 18   Ht 5\' 3"  (1.6 m)   Wt 50.8 kg   SpO2 96%   BMI 19.84 kg/m     98% on RA    BMI Readings from Last 3 Encounters:  11/16/20 20.28 kg/m  02/09/20 19.75 kg/m  12/27/19 19.49 kg/m       Wt Readings from Last 3 Encounters:  11/16/20 114 lb 8 oz (51.9 kg)  02/09/20 111 lb 8 oz (50.6 kg)  12/27/19 110 lb (49.9 kg)        CBC Labs (Brief)          Component Value Date/Time    WBC 6.1 12/09/2019 0938    RBC 4.20 12/09/2019 0938    HGB 13.9 12/09/2019 0938    HCT 41.0 12/09/2019 0938    PLT 272 12/09/2019 0938    MCV 97.6 12/09/2019 0938    MCH 33.1 (H) 12/09/2019 0938    MCHC 33.9 12/09/2019 0938    RDW 12.0 12/09/2019 0938    LYMPHSABS 1,513 12/09/2019 0938    EOSABS 159 12/09/2019 0938    BASOSABS 61 12/09/2019 0938          Chest Imaging:   CT scan of the chest 10/19/2020: Multiple bilateral pulmonary nodules largest at 13 mm.  These have progressed in size since her CT imaging in January 2022 from cardiac coronary CT imaging.\ We reviewed this today and compared them today in the office. The patient's images have been independently reviewed by me.    PET:  Low level uptake in bilateral lower lobe nodules   The patient's images have been independently reviewed by me.     Pulmonary Functions Testing Results: No flowsheet data found.   FeNO:    Pathology:    Echocardiogram:    Heart Catheterization:     Assessment & Plan:         ICD-10-CM    1. Lung nodules  R91.8 NM PET Image Initial (PI) Skull Base To Thigh      QuantiFERON-TB Gold Plus      Ambulatory referral to Pulmonology      Procedural/ Surgical Case Request: VIDEO BRONCHOSCOPY WITH ENDOBRONCHIAL NAVIGATION     2. Nonsmoker  Z78.9           Discussion:   65 year old female lifelong non-smoker, incidentally found lung nodules.  They have progressed in size and multitude since January of this past year.  We discussed the probability of underlying etiologies.  I suspect we are dealing with infectious disease disease, possibly nodular Mycobacterium.  Other differential diagnosis could include metastatic disease however I think would be very unusual to see basilar near symmetric bilateral subpleural nodules presenting as metastatic disease.  Also she is asymptomatic with any other issues at the moment. Up-to-date on cancer screenings.  Plan:  Here today for planned navigational bronchoscopy  Discussed the risks benefits and alternatives Patient is agreeable to proceed.    Garner Nash, DO [facility] 12/26/2020 6:31 AM

## 2020-12-26 NOTE — Anesthesia Postprocedure Evaluation (Signed)
Anesthesia Post Note  Patient: Jennifer Reyes  Procedure(s) Performed: VIDEO BRONCHOSCOPY WITH ENDOBRONCHIAL NAVIGATION (Bilateral) VIDEO BRONCHOSCOPY WITH RADIAL ENDOBRONCHIAL ULTRASOUND BRONCHIAL BIOPSIES BRONCHIAL BRUSHINGS BRONCHIAL NEEDLE ASPIRATION BIOPSIES BRONCHIAL WASHINGS     Patient location during evaluation: PACU Anesthesia Type: General Level of consciousness: awake and alert Pain management: pain level controlled Vital Signs Assessment: post-procedure vital signs reviewed and stable Respiratory status: spontaneous breathing, nonlabored ventilation, respiratory function stable and patient connected to nasal cannula oxygen Cardiovascular status: blood pressure returned to baseline and stable Postop Assessment: no apparent nausea or vomiting Anesthetic complications: no   No notable events documented.  Last Vitals:  Vitals:   12/26/20 0915 12/26/20 0930  BP: (!) 111/54 117/60  Pulse: 75 61  Resp: 14 20  Temp:  36.7 C  SpO2: 94% 95%    Last Pain:  Vitals:   12/26/20 0930  TempSrc:   PainSc: 0-No pain                 Masato Pettie

## 2020-12-26 NOTE — Interval H&P Note (Signed)
History and Physical Interval Note:  12/26/2020 6:40 AM  Jennifer Reyes  has presented today for surgery, with the diagnosis of lung nodules.  The various methods of treatment have been discussed with the patient and family. After consideration of risks, benefits and other options for treatment, the patient has consented to  Procedure(s) with comments: VIDEO BRONCHOSCOPY WITH ENDOBRONCHIAL NAVIGATION (Bilateral) - ION as a surgical intervention.  The patient's history has been reviewed, patient examined, no change in status, stable for surgery.  I have reviewed the patient's chart and labs.  Questions were answered to the patient's satisfaction.     Rachel Bo Kayslee Furey

## 2020-12-27 LAB — CYTOLOGY - NON PAP

## 2020-12-28 ENCOUNTER — Other Ambulatory Visit: Payer: Self-pay

## 2020-12-28 ENCOUNTER — Encounter: Payer: Self-pay | Admitting: Internal Medicine

## 2020-12-28 ENCOUNTER — Ambulatory Visit (INDEPENDENT_AMBULATORY_CARE_PROVIDER_SITE_OTHER): Payer: Medicare Other | Admitting: Internal Medicine

## 2020-12-28 VITALS — BP 98/68 | HR 64 | Temp 98.2°F | Ht 63.0 in | Wt 116.0 lb

## 2020-12-28 DIAGNOSIS — Z23 Encounter for immunization: Secondary | ICD-10-CM

## 2020-12-28 DIAGNOSIS — Z0000 Encounter for general adult medical examination without abnormal findings: Secondary | ICD-10-CM | POA: Diagnosis not present

## 2020-12-28 DIAGNOSIS — N3010 Interstitial cystitis (chronic) without hematuria: Secondary | ICD-10-CM

## 2020-12-28 DIAGNOSIS — M8580 Other specified disorders of bone density and structure, unspecified site: Secondary | ICD-10-CM

## 2020-12-28 DIAGNOSIS — R918 Other nonspecific abnormal finding of lung field: Secondary | ICD-10-CM

## 2020-12-28 LAB — POCT URINALYSIS DIPSTICK
Bilirubin, UA: NEGATIVE
Blood, UA: NEGATIVE
Glucose, UA: NEGATIVE
Ketones, UA: NEGATIVE
Leukocytes, UA: NEGATIVE
Nitrite, UA: NEGATIVE
Protein, UA: NEGATIVE
Spec Grav, UA: <=1.005 — AB (ref 1.010–1.025)
Urobilinogen, UA: 0.2 E.U./dL
pH, UA: 5 (ref 5.0–8.0)

## 2020-12-28 LAB — ACID FAST SMEAR (AFB, MYCOBACTERIA)
Acid Fast Smear: NEGATIVE
Acid Fast Smear: NEGATIVE

## 2020-12-28 LAB — CULTURE, BAL-QUANTITATIVE W GRAM STAIN: Culture: NO GROWTH

## 2020-12-28 NOTE — Progress Notes (Signed)
Annual Wellness Visit     Patient: Jennifer Reyes, Female    DOB: [birth year], 65 y.o.   MRN: [id number] Visit Date: 12/28/2020  Chief Complaint  Patient presents with   Medicare Wellness   Subjective    Jennifer Reyes is a 65 y.o. female who presents today for her Annual Wellness Visit.  HPI She also presents for health maintenance exam and evaluation of medical issues.  She is allergic to bee stings.  She is allergic to penicillin-it causes hives and a rash.  She had colonoscopy in 2012/06/13 by Dr. Kinnie Scales with 10-year follow-up recommended.  History of mild osteopenia followed by bone density study.  She has a history of interstitial cystitis treated by Dr. Logan Bores.  Evaluated for palpitations in Jun 13, 1989.  History of frozen right shoulder and frozen left shoulder in the past.  Tonsillectomy 1961.  Pilonidal cyst 1974.  Multiple urethral dilatations.  D&C for miscarriage 1993-06-13.  Anal fissure repair with Clorpactin treatment January 1994.  Hydrodilatation of bladder and cystoscopy August 1997.  Social history: She is married.  1 adult son.  Twin grandchildren.  Husband is a retired Biochemist, clinical.  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  She is a non-smoker.  Family history: Father passed away in Apr 19, 2019with complications of heart disease.  He was status post coronary artery bypass surgery and had hypertension.  Mother passed away in 2019-10-19with history of COPD.  3 sisters, 1 of whom has Mnire's disease.  Grandmother with history of Mnire's disease.  Patient had COVID in June 2022.  She had protracted maxillary sinus issues and was seen by video visit in late June.  She was treated with Zithromax and prednisone.  In January she had CT cardiac scoring with coronary calcium score being 0.  However on CT chest over read there was nodular thickening noted in right lung base.  Repeat CT was recommended in 6 to 12 months.  Patient had repeat CT in August showing interval  development of multiple bilateral pulmonary nodules measuring up to 13 mm.  This was suggestive of infectious or inflammatory etiology or perhaps atypical infection.  Follow-up chest CT was recommended in 3 months.  However patient saw Dr. Tonia Brooms in September.  Had PET scan and QuantiFERON test.  QuantiFERON test was negative.  Pulmonary nodules noted in left and right lung on PET scan with mild metabolic activity and indeterminate lung lesions.  Bronchoscopy was performed November 1.  Patient noted she has had chronic cough for some 25 years.  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  Bronchoscopy sample showed no evidence of malignancy.  Cultures including AFB were negative.  Not Icar plans repeat noncontrast CT of the lung in August 2023.      Patient Care Team: Margaree Mackintosh, MD as PCP - General (Internal Medicine)  Review of Systems see above   Objective    Vitals: BP 98/68   Pulse 64   Temp 98.2 F (36.8 C) (Tympanic)   Ht 5\' 3"  (1.6 m)   Wt 116 lb (52.6 kg)   SpO2 98%   BMI 20.55 kg/m   Physical Exam Constitutional:      General: She is not in acute distress.    Appearance: Normal appearance.  HENT:     Head: Normocephalic and atraumatic.     Right Ear: Tympanic membrane normal.     Left Ear: Tympanic membrane normal.  Mouth/Throat:     Pharynx: Oropharynx is clear.  Eyes:     General: No scleral icterus.       Right eye: No discharge.        Left eye: No discharge.     Extraocular Movements: Extraocular movements intact.     Conjunctiva/sclera: Conjunctivae normal.     Pupils: Pupils are equal, round, and reactive to light.  Cardiovascular:     Rate and Rhythm: Normal rate and regular rhythm.     Pulses: Normal pulses.     Heart sounds: Normal heart sounds. No murmur heard. Pulmonary:     Effort: Pulmonary effort is normal.     Breath sounds: Normal breath sounds. No wheezing or rales.  Abdominal:     General: Bowel sounds  are normal.     Palpations: Abdomen is soft. There is no mass.     Tenderness: There is no abdominal tenderness. There is no guarding or rebound.  Musculoskeletal:     Cervical back: Neck supple. No rigidity.     Right lower leg: No edema.     Left lower leg: No edema.  Lymphadenopathy:     Cervical: No cervical adenopathy.  Skin:    General: Skin is warm and dry.     Findings: No rash.  Neurological:     General: No focal deficit present.     Mental Status: She is alert and oriented to person, place, and time.     Cranial Nerves: No cranial nerve deficit.     Sensory: No sensory deficit.     Motor: No weakness.     Coordination: Coordination normal.     Gait: Gait normal.  Psychiatric:        Mood and Affect: Mood normal.        Behavior: Behavior normal.        Thought Content: Thought content normal.        Judgment: Judgment normal.     Most recent functional status assessment: In your present state of health, do you have any difficulty performing the following activities: 12/28/2020  Hearing? N  Vision? N  Difficulty concentrating or making decisions? N  Walking or climbing stairs? N  Dressing or bathing? N  Doing errands, shopping? N  Preparing Food and eating ? N  Using the Toilet? N  In the past six months, have you accidently leaked urine? N  Do you have problems with loss of bowel control? N  Managing your Medications? N  Managing your Finances? N  Housekeeping or managing your Housekeeping? N  Some recent data might be hidden   Most recent fall risk assessment: Fall Risk  12/28/2020  Falls in the past year? 0  Number falls in past yr: 0  Injury with Fall? 0  Risk for fall due to : No Fall Risks  Follow up Falls evaluation completed    Most recent depression screenings: PHQ 2/9 Scores 12/28/2020 12/27/2019  PHQ - 2 Score 0 0   Most recent cognitive screening: 6CIT Screen 12/28/2020  What Year? 0 points  What month? 0 points  What time? 0 points   Count back from 20 0 points  Months in reverse 0 points  Repeat phrase 0 points  Total Score 0       Assessment & Plan     Annual wellness visit done today including the all of the following: Reviewed patient's Family Medical History Reviewed and updated list of patient's medical providers Assessment of cognitive  impairment was done Assessed patient's functional ability Established a written schedule for health screening services Health Risk Assessent Completed and Reviewed  Discussed health benefits of physical activity, and encouraged her to engage in regular exercise appropriate for her age and condition.    Abnormal chest CT.  Had negative bronchoscopy by Dr. I card and will be followed up by him in a year.  Cultures and pathology were negative.  History of interstitial cystitis followed by Urologist Dr. Logan Bores  Mild osteopenia  HSV type II treated with valacyclovir daily  Elevated LDL of 123 but coronary calcium score is 0.  Continue to monitor.  Recheck lipids in 6 months.     {I, Margaree Mackintosh, MD, have reviewed all documentation for this visit. The documentation on 02/23/21 for the exam, diagnosis, procedures, and orders are all accurate and complete.   Jama Flavors, CMA

## 2020-12-31 LAB — AEROBIC/ANAEROBIC CULTURE W GRAM STAIN (SURGICAL/DEEP WOUND)
Culture: NO GROWTH
Gram Stain: NONE SEEN

## 2020-12-31 LAB — ANAEROBIC CULTURE W GRAM STAIN

## 2021-01-01 ENCOUNTER — Telehealth: Payer: Self-pay | Admitting: Acute Care

## 2021-01-01 NOTE — Telephone Encounter (Signed)
I called the patient to talk to her about the results of her navigational bronc and biopsies.  There was no answer however I did leave a message on her voicemail letting her know that there were no malignant cells identified, but that there were some mixed inflammatory cells.  I left her the office number and asked her to call if she had any additional questions.  She is scheduled for an appointment with Dr. Tonia Brooms on January 17, 2021.  I did reassure her Dr. Tonia Brooms could review the results with her at that time but if she had questions prior to please call the office.  Again contact information was shared with the patient. Triage if patient calls back please send to Dr. Myrlene Broker box, so that he can discuss any plans for follow-up if any is in fact needed.

## 2021-01-16 LAB — CULTURE, FUNGUS WITHOUT SMEAR

## 2021-01-17 ENCOUNTER — Other Ambulatory Visit: Payer: Self-pay

## 2021-01-17 ENCOUNTER — Encounter: Payer: Self-pay | Admitting: Pulmonary Disease

## 2021-01-17 ENCOUNTER — Ambulatory Visit (INDEPENDENT_AMBULATORY_CARE_PROVIDER_SITE_OTHER): Payer: Medicare Other | Admitting: Pulmonary Disease

## 2021-01-17 VITALS — BP 114/68 | HR 61 | Temp 97.8°F | Ht 64.0 in | Wt 117.8 lb

## 2021-01-17 DIAGNOSIS — R918 Other nonspecific abnormal finding of lung field: Secondary | ICD-10-CM | POA: Diagnosis not present

## 2021-01-17 DIAGNOSIS — Z789 Other specified health status: Secondary | ICD-10-CM

## 2021-01-17 NOTE — Progress Notes (Signed)
Synopsis: Referred in September 2022 for lung nodules  by Elby Showers, MD  Subjective:   PATIENT ID: Jennifer Reyes GENDER: female DOB: 04-11-1955, MRN: [id number]  Chief Complaint  Patient presents with   Follow-up    Follow up on bronch    This is a 65 year old female, past medical history of hyperlipidemia.  Lifelong non-smoker, no significant family history for malignancy.  She is up-to-date on colon cancer screening and breast cancer screening.  She had a coronary calcium scoring CT in January which revealed a new right lower lobe nodule.  Ultimately had follow-up CT imaging of the chest with primary care which revealed multiple newer nodular densities within the lower lobe as well as a small area of patchy tree-in-bud.  This has progressed since her calcium scoring CT.  We reviewed CT imaging today in the office.  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  At the [facility] hospital she was definitely exposed to the microbiology side to include NTM and TB.  She has had negative TB testing in the past.  She also worked at the [employer].  Of note she states she has had a nonproductive cough for at least 20+ years.  OV 01/17/2021: Here today for follow-up after recent bronchoscopy.  All tissue sampling was negative for malignancy for the lung nodules.  Culture results are still pending.  No growth to date on fungus or AFB.  She still has persistent cough but this has been going on for 25+ years.   Past Medical History:  Diagnosis Date   Bladder pain    Frequency of urination    Hyperlipidemia    Interstitial cystitis    Nocturia    PONV (postoperative nausea and vomiting)    Urgency of urination      Family History  Problem Relation Age of Onset   Heart disease Father    Hypertension Father      Past Surgical History:  Procedure Laterality Date   BRONCHIAL BIOPSY  12/26/2020   Procedure: BRONCHIAL BIOPSIES;  Surgeon: Garner Nash, DO;  Location: [place]  ENDOSCOPY;  Service: Pulmonary;;   BRONCHIAL BRUSHINGS  12/26/2020   Procedure: BRONCHIAL BRUSHINGS;  Surgeon: Garner Nash, DO;  Location: [place] ENDOSCOPY;  Service: Pulmonary;;   BRONCHIAL NEEDLE ASPIRATION BIOPSY  12/26/2020   Procedure: BRONCHIAL NEEDLE ASPIRATION BIOPSIES;  Surgeon: Garner Nash, DO;  Location: [place] ENDOSCOPY;  Service: Pulmonary;;   BRONCHIAL WASHINGS  12/26/2020   Procedure: BRONCHIAL WASHINGS;  Surgeon: Garner Nash, DO;  Location: [place];  Service: Pulmonary;;   BRONCHOSCOPY  12/26/2020   CYSTO WITH HYDRODISTENSION  12/23/2011   Procedure: CYSTOSCOPY/HYDRODISTENSION;  Surgeon: Bernestine Amass, MD;  Location: [facility];  Service: Urology;  Laterality: N/A;  30 MIN CYSTO, HYDRODISTENSION, URETHRAL CALIBRATION, INSTILLATION MARCAINEAND PYRIDIUM, INJECTION MARCAINE AND KENALOG    CYSTO/ HOD/ URETHRAL DILATION / INSTILLATION THERAPY  10/11/2005   DILATION AND CURETTAGE OF UTERUS     LEFT CLOSED SHOULDER MANIPULATION  04/06/2004   CHRONIC ADHESIVE  CAPSULITIS   MASS EXCISION Left 06/04/2012   Procedure: EXCISIONAL BIOPSY OF MASS LEFT RING FINGER;  Surgeon: Cammie Sickle., MD;  Location: [place];  Service: Orthopedics;  Laterality: Left;   PILONIDAL CYST EXCISION     TONSILLECTOMY     TUBAL LIGATION     VIDEO BRONCHOSCOPY WITH ENDOBRONCHIAL NAVIGATION Bilateral 12/26/2020   Procedure: VIDEO BRONCHOSCOPY WITH ENDOBRONCHIAL NAVIGATION;  Surgeon: Garner Nash, DO;  Location: [place] [facility];  Service: Pulmonary;  Laterality: Bilateral;  ION   VIDEO BRONCHOSCOPY WITH RADIAL ENDOBRONCHIAL ULTRASOUND  12/26/2020   Procedure: VIDEO BRONCHOSCOPY WITH RADIAL ENDOBRONCHIAL ULTRASOUND;  Surgeon: Garner Nash, DO;  Location: [facility];  Service: Pulmonary;;    Social History   Socioeconomic History   Marital status: Married    Spouse name: Not on file   Number of children: 1   Years of education: Not on file   Highest  education level: Not on file  Occupational History    Comment: Retired  Tobacco Use   Smoking status: Never   Smokeless tobacco: Never  Vaping Use   Vaping Use: Never used  Substance and Sexual Activity   Alcohol use: No   Drug use: No   Sexual activity: Not on file  Other Topics Concern   Not on file  Social History Narrative   Not on file   Social Determinants of Health   Financial Resource Strain: Not on file  Food Insecurity: Not on file  Transportation Needs: Not on file  Physical Activity: Not on file  Stress: Not on file  Social Connections: Not on file  Intimate Partner Violence: Not on file     Allergies  Allergen Reactions   Penicillins Hives   Levofloxacin Nausea And Vomiting   Meperidine Nausea And Vomiting   Prednisone     Headache, Nausea     Outpatient Medications Prior to Visit  Medication Sig Dispense Refill   bupivacaine (MARCAINE) 0.5 % injection 15 mLs by Other route as needed.     calcium carbonate (OS-CAL) 600 MG TABS Take 600 mg by mouth every other day.     cholecalciferol (VITAMIN D) 1000 UNITS tablet Take 1,000 Units by mouth daily.     ESTRACE VAGINAL 0.1 MG/GM vaginal cream Place 1 Applicatorful vaginally 2 (two) times a week.     hydrOXYzine (ATARAX/VISTARIL) 10 MG tablet Take 10 mg by mouth at bedtime as needed for anxiety.     lidocaine (XYLOCAINE) 5 % ointment Apply 1 application topically daily as needed for moderate pain.     Lutein 20 MG TABS Take 20 mg by mouth daily.     oxybutynin (DITROPAN) 5 MG tablet Take 5 mg by mouth at bedtime.     oxyCODONE-acetaminophen (PERCOCET) 7.5-325 MG tablet Take 1 tablet by mouth every 4 (four) hours as needed for severe pain.     Pentosan Polysulfate Sodium 150 MG CPDR Take 150 mg by mouth 2 (two) times daily.      valACYclovir (VALTREX) 500 MG tablet TAKE 1 TABLET BY MOUTH DAILY FOR PROPHYLAXIS (Patient taking differently: Take 250 mg by mouth daily. FOR PROPHYLAXIS) 90 tablet 3   No  facility-administered medications prior to visit.    Review of Systems  Constitutional:  Negative for chills, fever, malaise/fatigue and weight loss.  HENT:  Negative for hearing loss, sore throat and tinnitus.   Eyes:  Negative for blurred vision and double vision.  Respiratory:  Positive for cough. Negative for hemoptysis, sputum production, shortness of breath, wheezing and stridor.   Cardiovascular:  Negative for chest pain, palpitations, orthopnea, leg swelling and PND.  Gastrointestinal:  Negative for abdominal pain, constipation, diarrhea, heartburn, nausea and vomiting.  Genitourinary:  Negative for dysuria, hematuria and urgency.  Musculoskeletal:  Negative for joint pain and myalgias.  Skin:  Negative for itching and rash.  Neurological:  Negative for dizziness, tingling, weakness and headaches.  Endo/Heme/Allergies:  Negative for environmental allergies. Does not bruise/bleed easily.  Psychiatric/Behavioral:  Negative for depression. The patient is not nervous/anxious and does not have insomnia.   All other systems reviewed and are negative.   Objective:  Physical Exam Vitals reviewed.  Constitutional:      General: She is not in acute distress.    Appearance: She is well-developed.  HENT:     Head: Normocephalic and atraumatic.  Eyes:     General: No scleral icterus.    Conjunctiva/sclera: Conjunctivae normal.     Pupils: Pupils are equal, round, and reactive to light.  Neck:     Vascular: No JVD.     Trachea: No tracheal deviation.  Cardiovascular:     Rate and Rhythm: Normal rate and regular rhythm.     Heart sounds: Normal heart sounds. No murmur heard. Pulmonary:     Effort: Pulmonary effort is normal. No tachypnea, accessory muscle usage or respiratory distress.     Breath sounds: Normal breath sounds. No stridor. No wheezing, rhonchi or rales.  Abdominal:     General: Bowel sounds are normal. There is no distension.     Palpations: Abdomen is soft.      Tenderness: There is no abdominal tenderness.  Musculoskeletal:        General: No tenderness.     Cervical back: Neck supple.  Lymphadenopathy:     Cervical: No cervical adenopathy.  Skin:    General: Skin is warm and dry.     Capillary Refill: Capillary refill takes less than 2 seconds.     Findings: No rash.     Comments: Dry palms of the hands  Neurological:     Mental Status: She is alert and oriented to person, place, and time.  Psychiatric:        Behavior: Behavior normal.     Vitals:   01/17/21 0955  BP: 114/68  Pulse: 61  Temp: 97.8 F (36.6 C)  TempSrc: Oral  SpO2: 98%  Weight: 117 lb 12.8 oz (53.4 kg)  Height: 5\' 4"  (1.626 m)   98% on RA BMI Readings from Last 3 Encounters:  01/17/21 20.22 kg/m  12/28/20 20.55 kg/m  12/26/20 19.84 kg/m   Wt Readings from Last 3 Encounters:  01/17/21 117 lb 12.8 oz (53.4 kg)  12/28/20 116 lb (52.6 kg)  12/26/20 112 lb (50.8 kg)     CBC    Component Value Date/Time   WBC 7.0 12/22/2020 1000   RBC 4.22 12/22/2020 1000   HGB 13.9 12/22/2020 1000   HCT 40.9 12/22/2020 1000   PLT 277 12/22/2020 1000   MCV 96.9 12/22/2020 1000   MCH 32.9 12/22/2020 1000   MCHC 34.0 12/22/2020 1000   RDW 12.4 12/22/2020 1000   LYMPHSABS 1,302 12/22/2020 1000   EOSABS 301 12/22/2020 1000   BASOSABS 70 12/22/2020 1000     Chest Imaging:  CT scan of the chest 10/19/2020: Multiple bilateral pulmonary nodules largest at 13 mm.  These have progressed in size since her CT imaging in January 2022 from cardiac coronary CT imaging.\ We reviewed this today and compared them today in the office. The patient's images have been independently reviewed by me.    Pulmonary Functions Testing Results: No flowsheet data found.  FeNO:   Pathology:   Echocardiogram:   Heart Catheterization:     Assessment & Plan:     ICD-10-CM   1. Lung nodules  R91.8 CT CHEST HIGH RESOLUTION    2. Nonsmoker  Z78.9       Discussion:  This is a  65 year old female, lifelong non-smoker, incidentally found lung nodules.  A few had progressed in size from previous imaging this past January.  After discussion of possibilities of malignancy versus atypical infection.  Patient wanted to proceed with bronchoscopy.  Here today for follow-up after bronchoscopy.  Plan: Still no cultures with anything of growth so far. Will continue to follow those out for a few more weeks. Pathology results negative for malignancy. We will plan for a repeat noncontrasted CT scan 1 year from her previous which will be August 2023. Patient is agreeable to this plan.     Current Outpatient Medications:    bupivacaine (MARCAINE) 0.5 % injection, 15 mLs by Other route as needed., Disp: , Rfl:    calcium carbonate (OS-CAL) 600 MG TABS, Take 600 mg by mouth every other day., Disp: , Rfl:    cholecalciferol (VITAMIN D) 1000 UNITS tablet, Take 1,000 Units by mouth daily., Disp: , Rfl:    ESTRACE VAGINAL 0.1 MG/GM vaginal cream, Place 1 Applicatorful vaginally 2 (two) times a week., Disp: , Rfl:    hydrOXYzine (ATARAX/VISTARIL) 10 MG tablet, Take 10 mg by mouth at bedtime as needed for anxiety., Disp: , Rfl:    lidocaine (XYLOCAINE) 5 % ointment, Apply 1 application topically daily as needed for moderate pain., Disp: , Rfl:    Lutein 20 MG TABS, Take 20 mg by mouth daily., Disp: , Rfl:    oxybutynin (DITROPAN) 5 MG tablet, Take 5 mg by mouth at bedtime., Disp: , Rfl:    oxyCODONE-acetaminophen (PERCOCET) 7.5-325 MG tablet, Take 1 tablet by mouth every 4 (four) hours as needed for severe pain., Disp: , Rfl:    Pentosan Polysulfate Sodium 150 MG CPDR, Take 150 mg by mouth 2 (two) times daily. , Disp: , Rfl:    valACYclovir (VALTREX) 500 MG tablet, TAKE 1 TABLET BY MOUTH DAILY FOR PROPHYLAXIS (Patient taking differently: Take 250 mg by mouth daily. FOR PROPHYLAXIS), Disp: 90 tablet, Rfl: 3  I spent 32 minutes dedicated to the care of this patient on the date of this  encounter to include pre-visit review of records, face-to-face time with the patient discussing conditions above, post visit ordering of testing, clinical documentation with the electronic health record, making appropriate referrals as documented, and communicating necessary findings to members of the patients care team.    Josephine Igo, DO [facility] 01/17/2021 3:12 PM

## 2021-01-17 NOTE — Patient Instructions (Addendum)
Thank you for visiting Dr. Tonia Brooms at [facility]. Today we recommend the following:  Orders Placed This Encounter  Procedures   CT CHEST HIGH RESOLUTION   Repeat CT Chest in August 2023  Return in about 10 months (around 11/17/2021) for with APP or Dr. Tonia Brooms.    Please do your part to reduce the spread of COVID-19.

## 2021-01-25 LAB — FUNGUS CULTURE WITH STAIN

## 2021-01-25 LAB — FUNGAL ORGANISM REFLEX

## 2021-01-25 LAB — FUNGUS CULTURE RESULT

## 2021-01-30 ENCOUNTER — Telehealth: Payer: Self-pay | Admitting: Internal Medicine

## 2021-01-30 DIAGNOSIS — N958 Other specified menopausal and perimenopausal disorders: Secondary | ICD-10-CM

## 2021-01-30 DIAGNOSIS — M8580 Other specified disorders of bone density and structure, unspecified site: Secondary | ICD-10-CM

## 2021-01-30 MED ORDER — ACYCLOVIR 5 % EX OINT: 1.0000 "application " | TOPICAL_OINTMENT | CUTANEOUS | 0 refills | Status: DC

## 2021-01-30 NOTE — Telephone Encounter (Signed)
Jennifer Reyes  [phone]  Jennifer Reyes called to say she had been to one of her other doctors and they said it was time for her Bone Density and wanted to know if you would order it?   Then she wanted to know if you would order her some acyclovir ointment, what she has is old and just about out. She is not having any symptoms just wants to keep some on hand.

## 2021-01-30 NOTE — Telephone Encounter (Signed)
Both BD and medication ordered.

## 2021-02-08 LAB — ACID FAST CULTURE WITH REFLEXED SENSITIVITIES (MYCOBACTERIA)
Acid Fast Culture: NEGATIVE
Acid Fast Culture: NEGATIVE

## 2021-02-23 NOTE — Patient Instructions (Addendum)
It was a pleasure to see you today.  Continue diet and exercise regimen.  Recheck lipid panel in 6 months as LDL is elevated.  Pneumococcal vaccine given today.

## 2021-03-23 ENCOUNTER — Ambulatory Visit: Payer: Medicare Other | Admitting: Cardiology

## 2021-07-02 ENCOUNTER — Other Ambulatory Visit: Payer: Medicare Other

## 2021-09-27 ENCOUNTER — Other Ambulatory Visit: Payer: Medicare Other

## 2021-10-18 ENCOUNTER — Other Ambulatory Visit: Payer: Medicare Other

## 2021-11-13 DIAGNOSIS — N9489 Other specified conditions associated with female genital organs and menstrual cycle: Secondary | ICD-10-CM | POA: Insufficient documentation

## 2021-11-13 DIAGNOSIS — N952 Postmenopausal atrophic vaginitis: Secondary | ICD-10-CM | POA: Insufficient documentation

## 2021-11-19 ENCOUNTER — Other Ambulatory Visit: Payer: Self-pay | Admitting: Internal Medicine

## 2021-11-19 DIAGNOSIS — N958 Other specified menopausal and perimenopausal disorders: Secondary | ICD-10-CM

## 2021-11-19 DIAGNOSIS — M8580 Other specified disorders of bone density and structure, unspecified site: Secondary | ICD-10-CM

## 2021-11-20 ENCOUNTER — Ambulatory Visit (INDEPENDENT_AMBULATORY_CARE_PROVIDER_SITE_OTHER): Payer: Medicare Other

## 2021-11-20 DIAGNOSIS — R918 Other nonspecific abnormal finding of lung field: Secondary | ICD-10-CM

## 2021-11-23 ENCOUNTER — Other Ambulatory Visit: Payer: Self-pay | Admitting: Internal Medicine

## 2021-11-28 ENCOUNTER — Other Ambulatory Visit: Payer: Medicare Other

## 2021-11-28 ENCOUNTER — Ambulatory Visit (INDEPENDENT_AMBULATORY_CARE_PROVIDER_SITE_OTHER): Payer: Medicare Other | Admitting: Pulmonary Disease

## 2021-11-28 ENCOUNTER — Encounter: Payer: Self-pay | Admitting: Pulmonary Disease

## 2021-11-28 VITALS — BP 120/70 | HR 73 | Ht 63.0 in | Wt 115.6 lb

## 2021-11-28 DIAGNOSIS — R918 Other nonspecific abnormal finding of lung field: Secondary | ICD-10-CM | POA: Diagnosis not present

## 2021-11-28 DIAGNOSIS — Z23 Encounter for immunization: Secondary | ICD-10-CM | POA: Diagnosis not present

## 2021-11-28 DIAGNOSIS — Z789 Other specified health status: Secondary | ICD-10-CM | POA: Diagnosis not present

## 2021-11-28 DIAGNOSIS — J4520 Mild intermittent asthma, uncomplicated: Secondary | ICD-10-CM | POA: Diagnosis not present

## 2021-11-28 MED ORDER — FLUTICASONE-SALMETEROL 55-14 MCG/ACT IN AEPB: 2.0000 | INHALATION_SPRAY | Freq: Two times a day (BID) | RESPIRATORY_TRACT | 6 refills | Status: DC

## 2021-11-28 NOTE — Patient Instructions (Addendum)
Thank you for visiting Dr. Valeta Harms at [facility]. Today we recommend the following:  Orders Placed This Encounter  Procedures   CT Chest Wo Contrast   Meds ordered this encounter  Medications   Fluticasone-Salmeterol 55-14 MCG/ACT AEPB    Sig: Inhale 2 puffs into the lungs 2 (two) times daily.    Dispense:  1 each    Refill:  6   Return in about 2 years (around 11/29/2023) for with APP or Dr. Valeta Harms. See [place] in 2 years.     Please do your part to reduce the spread of COVID-19.

## 2021-11-28 NOTE — Progress Notes (Signed)
Synopsis: Referred in September 2022 for lung nodules  by Elby Showers, MD  Subjective:   PATIENT ID: Jennifer Reyes GENDER: female DOB: 1956/02/12, MRN: [id number]  Chief Complaint  Patient presents with   Follow-up    This is a 66 year old female, past medical history of hyperlipidemia.  Lifelong non-smoker, no significant family history for malignancy.  She is up-to-date on colon cancer screening and breast cancer screening.  She had a coronary calcium scoring CT in January which revealed a new right lower lobe nodule.  Ultimately had follow-up CT imaging of the chest with primary care which revealed multiple newer nodular densities within the lower lobe as well as a small area of patchy tree-in-bud.  This has progressed since her calcium scoring CT.  We reviewed CT imaging today in the office.  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  At the [facility] hospital she was definitely exposed to the microbiology side to include NTM and TB.  She has had negative TB testing in the past.  She also worked at the [employer].  Of note she states she has had a nonproductive cough for at least 20+ years.  OV 01/17/2021: Here today for follow-up after recent bronchoscopy.  All tissue sampling was negative for malignancy for the lung nodules.  Culture results are still pending.  No growth to date on fungus or AFB.  She still has persistent cough but this has been going on for 25+ years.  OV 11/28/2021: Here today for follow-up.  Cultures negative from bronchoscopy.  AFB fungus negative.  Repeat CT scan of the chest reveals resolution of the previous nodules that we had evaluated.  She does have a small new nodule in the right lower lobe.  She is a lifelong non-smoker I suspect that these are still inflammatory waxing and waning.  I still think that NTM is high on the differential but we were unable to identify via cultures.  She still has her baseline chronic cough.  Exacerbated by changes  in temperature or changes in exertion.  She also thinks it is exacerbated by seasonal allergies.    Past Medical History:  Diagnosis Date   Bladder pain    Frequency of urination    Hyperlipidemia    Interstitial cystitis    Nocturia    PONV (postoperative nausea and vomiting)    Urgency of urination      Family History  Problem Relation Age of Onset   Heart disease Father    Hypertension Father      Past Surgical History:  Procedure Laterality Date   BRONCHIAL BIOPSY  12/26/2020   Procedure: BRONCHIAL BIOPSIES;  Surgeon: Garner Nash, DO;  Location: [facility];  Service: Pulmonary;;   BRONCHIAL BRUSHINGS  12/26/2020   Procedure: BRONCHIAL BRUSHINGS;  Surgeon: Garner Nash, DO;  Location: [facility];  Service: Pulmonary;;   BRONCHIAL NEEDLE ASPIRATION BIOPSY  12/26/2020   Procedure: BRONCHIAL NEEDLE ASPIRATION BIOPSIES;  Surgeon: Garner Nash, DO;  Location: [facility];  Service: Pulmonary;;   BRONCHIAL WASHINGS  12/26/2020   Procedure: BRONCHIAL WASHINGS;  Surgeon: Garner Nash, DO;  Location: [facility];  Service: Pulmonary;;   BRONCHOSCOPY  12/26/2020   CYSTO WITH HYDRODISTENSION  12/23/2011   Procedure: CYSTOSCOPY/HYDRODISTENSION;  Surgeon: Bernestine Amass, MD;  Location: [facility];  Service: Urology;  Laterality: N/A;  30 MIN CYSTO, HYDRODISTENSION, URETHRAL CALIBRATION, INSTILLATION MARCAINEAND PYRIDIUM, INJECTION MARCAINE AND KENALOG  CYSTO/ HOD/ URETHRAL DILATION / INSTILLATION THERAPY  10/11/2005   DILATION AND CURETTAGE OF UTERUS     LEFT CLOSED SHOULDER MANIPULATION  04/06/2004   CHRONIC ADHESIVE  CAPSULITIS   MASS EXCISION Left 06/04/2012   Procedure: EXCISIONAL BIOPSY OF MASS LEFT RING FINGER;  Surgeon: Cammie Sickle., MD;  Location: [facility];  Service: Orthopedics;  Laterality: Left;   PILONIDAL CYST EXCISION     TONSILLECTOMY     TUBAL LIGATION     VIDEO BRONCHOSCOPY WITH ENDOBRONCHIAL NAVIGATION  Bilateral 12/26/2020   Procedure: VIDEO BRONCHOSCOPY WITH ENDOBRONCHIAL NAVIGATION;  Surgeon: Garner Nash, DO;  [facility];  Service: Pulmonary;  Laterality: Bilateral;  ION   VIDEO BRONCHOSCOPY WITH RADIAL ENDOBRONCHIAL ULTRASOUND  12/26/2020   Procedure: VIDEO BRONCHOSCOPY WITH RADIAL ENDOBRONCHIAL ULTRASOUND;  Surgeon: Garner Nash, DO;  Location: [facility];  Service: Pulmonary;;    Social History   Socioeconomic History   Marital status: Married    Spouse name: Not on file   Number of children: 1   Years of education: Not on file   Highest education level: Not on file  Occupational History    Comment: Retired  Tobacco Use   Smoking status: Never   Smokeless tobacco: Never  Vaping Use   Vaping Use: Never used  Substance and Sexual Activity   Alcohol use: No   Drug use: No   Sexual activity: Not on file  Other Topics Concern   Not on file  Social History Narrative   Not on file   Social Determinants of Health   Financial Resource Strain: Not on file  Food Insecurity: Not on file  Transportation Needs: Not on file  Physical Activity: Not on file  Stress: Not on file  Social Connections: Not on file  Intimate Partner Violence: Not on file     Allergies  Allergen Reactions   Penicillins Hives   Levofloxacin Nausea And Vomiting   Meperidine Nausea And Vomiting   Prednisone     Headache, Nausea     Outpatient Medications Prior to Visit  Medication Sig Dispense Refill   acyclovir ointment (ZOVIRAX) 5 % Apply 1 application topically every 3 (three) hours. 30 g 0   bupivacaine (MARCAINE) 0.5 % injection 15 mLs by Other route as needed.     calcium carbonate (OS-CAL) 600 MG TABS Take 600 mg by mouth every other day.     cholecalciferol (VITAMIN D) 1000 UNITS tablet Take 1,000 Units by mouth daily.     ESTRACE VAGINAL 0.1 MG/GM vaginal cream Place 1 Applicatorful vaginally 2 (two) times a week.     hydrOXYzine (ATARAX/VISTARIL) 10 MG tablet Take  10 mg by mouth at bedtime as needed for anxiety. (Patient not taking: Reported on 11/28/2021)     lidocaine (XYLOCAINE) 5 % ointment Apply 1 application topically daily as needed for moderate pain.     Lutein 20 MG TABS Take 20 mg by mouth daily.     oxybutynin (DITROPAN) 5 MG tablet Take 5 mg by mouth at bedtime.     oxyCODONE-acetaminophen (PERCOCET) 7.5-325 MG tablet Take 1 tablet by mouth every 4 (four) hours as needed for severe pain.     Pentosan Polysulfate Sodium 150 MG CPDR Take 150 mg by mouth 2 (two) times daily.      valACYclovir (VALTREX) 500 MG tablet TAKE ONE TABLET BY MOUTH DAILY FOR PROPHYLAXIS 90 tablet 3   No facility-administered medications prior to visit.    Review  of Systems  Constitutional:  Negative for chills, fever, malaise/fatigue and weight loss.  HENT:  Negative for hearing loss, sore throat and tinnitus.   Eyes:  Negative for blurred vision and double vision.  Respiratory:  Positive for cough. Negative for hemoptysis, sputum production, shortness of breath, wheezing and stridor.   Cardiovascular:  Negative for chest pain, palpitations, orthopnea, leg swelling and PND.  Gastrointestinal:  Negative for abdominal pain, constipation, diarrhea, heartburn, nausea and vomiting.  Genitourinary:  Negative for dysuria, hematuria and urgency.  Musculoskeletal:  Negative for joint pain and myalgias.  Skin:  Negative for itching and rash.  Neurological:  Negative for dizziness, tingling, weakness and headaches.  Endo/Heme/Allergies:  Negative for environmental allergies. Does not bruise/bleed easily.  Psychiatric/Behavioral:  Negative for depression. The patient is not nervous/anxious and does not have insomnia.   All other systems reviewed and are negative.    Objective:  Physical Exam Vitals reviewed.  Constitutional:      General: She is not in acute distress.    Appearance: She is well-developed.  HENT:     Head: Normocephalic and atraumatic.  Eyes:      General: No scleral icterus.    Conjunctiva/sclera: Conjunctivae normal.     Pupils: Pupils are equal, round, and reactive to light.  Neck:     Vascular: No JVD.     Trachea: No tracheal deviation.  Cardiovascular:     Rate and Rhythm: Normal rate and regular rhythm.     Heart sounds: Normal heart sounds. No murmur heard. Pulmonary:     Effort: Pulmonary effort is normal. No tachypnea, accessory muscle usage or respiratory distress.     Breath sounds: No stridor. No wheezing, rhonchi or rales.  Abdominal:     General: There is no distension.     Palpations: Abdomen is soft.     Tenderness: There is no abdominal tenderness.  Musculoskeletal:        General: No tenderness.     Cervical back: Neck supple.  Lymphadenopathy:     Cervical: No cervical adenopathy.  Skin:    General: Skin is warm and dry.     Capillary Refill: Capillary refill takes less than 2 seconds.     Findings: No rash.  Neurological:     Mental Status: She is alert and oriented to person, place, and time.  Psychiatric:        Behavior: Behavior normal.      Vitals:   11/28/21 1124  BP: 120/70  Pulse: 73  SpO2: 98%  Weight: 115 lb 9.6 oz (52.4 kg)  Height: 5\' 3"  (1.6 m)   98% on RA BMI Readings from Last 3 Encounters:  11/28/21 20.48 kg/m  01/17/21 20.22 kg/m  12/28/20 20.55 kg/m   Wt Readings from Last 3 Encounters:  11/28/21 115 lb 9.6 oz (52.4 kg)  01/17/21 117 lb 12.8 oz (53.4 kg)  12/28/20 116 lb (52.6 kg)     CBC    Component Value Date/Time   WBC 7.0 12/22/2020 1000   RBC 4.22 12/22/2020 1000   HGB 13.9 12/22/2020 1000   HCT 40.9 12/22/2020 1000   PLT 277 12/22/2020 1000   MCV 96.9 12/22/2020 1000   MCH 32.9 12/22/2020 1000   MCHC 34.0 12/22/2020 1000   RDW 12.4 12/22/2020 1000   LYMPHSABS 1,302 12/22/2020 1000   EOSABS 301 12/22/2020 1000   BASOSABS 70 12/22/2020 1000     Chest Imaging:  CT scan of the chest 10/19/2020: Multiple bilateral pulmonary  nodules largest at  13 mm.  These have progressed in size since her CT imaging in January 2022 from cardiac coronary CT imaging.\ We reviewed this today and compared them today in the office. The patient's images have been independently reviewed by me.    September 2023 CT chest: Resolution of previous pulmonary nodules.  Has a new right lower lobe nodule. The patient's images have been independently reviewed by me.    Pulmonary Functions Testing Results:     No data to display          FeNO:   Pathology:   Echocardiogram:   Heart Catheterization:     Assessment & Plan:     ICD-10-CM   1. Lung nodules  R91.8 CT Chest Wo Contrast    2. Nonsmoker  Z78.9     3. Mild intermittent reactive airway disease without complication  A999333        Discussion:  This is a 66 year old female lifelong non-smoker incidental found pulmonary nodules.  Lives with her husband retired in [place].  Follow-up CT imaging reveals stability of her nodules and resolution of the previous seen subpleural nodules.  She has a new small right lower lobe nodule.  I suspect from the same etiology.  Likely inflammatory.  Cultures remain negative from previous bronchoscopy.  Plan: Repeat noncontrasted CT chest in 2 years. As for her recurrent cough I think she may have a component of reactive airway disease We are going to to treat her empirically with ICS/LABA.Start her on a low-dose and see if this makes a difference in her symptoms.  She is can let us know if it helps.  If it does we will happy to keep her on this inhaler. She is ready to try to give an inhaler to try to see if it helps her cough. RTC in 2 years after CT chest.   Current Outpatient Medications:    Fluticasone-Salmeterol 55-14 MCG/ACT AEPB, Inhale 2 puffs into the lungs 2 (two) times daily., Disp: 1 each, Rfl: 6   acyclovir ointment (ZOVIRAX) 5 %, Apply 1 application topically every 3 (three) hours., Disp: 30 g, Rfl: 0   bupivacaine (MARCAINE) 0.5  % injection, 15 mLs by Other route as needed., Disp: , Rfl:    calcium carbonate (OS-CAL) 600 MG TABS, Take 600 mg by mouth every other day., Disp: , Rfl:    cholecalciferol (VITAMIN D) 1000 UNITS tablet, Take 1,000 Units by mouth daily., Disp: , Rfl:    ESTRACE VAGINAL 0.1 MG/GM vaginal cream, Place 1 Applicatorful vaginally 2 (two) times a week., Disp: , Rfl:    hydrOXYzine (ATARAX/VISTARIL) 10 MG tablet, Take 10 mg by mouth at bedtime as needed for anxiety. (Patient not taking: Reported on 11/28/2021), Disp: , Rfl:    lidocaine (XYLOCAINE) 5 % ointment, Apply 1 application topically daily as needed for moderate pain., Disp: , Rfl:    Lutein 20 MG TABS, Take 20 mg by mouth daily., Disp: , Rfl:    oxybutynin (DITROPAN) 5 MG tablet, Take 5 mg by mouth at bedtime., Disp: , Rfl:    oxyCODONE-acetaminophen (PERCOCET) 7.5-325 MG tablet, Take 1 tablet by mouth every 4 (four) hours as needed for severe pain., Disp: , Rfl:    Pentosan Polysulfate Sodium 150 MG CPDR, Take 150 mg by mouth 2 (two) times daily. , Disp: , Rfl:    valACYclovir (VALTREX) 500 MG tablet, TAKE ONE TABLET BY MOUTH DAILY FOR PROPHYLAXIS, Disp: 90 tablet, Rfl: 3   Leory Plowman  Marvel Plan, DO [facility] 11/28/2021 11:52 AM

## 2021-12-03 ENCOUNTER — Telehealth: Payer: Self-pay | Admitting: Pulmonary Disease

## 2021-12-03 ENCOUNTER — Other Ambulatory Visit (HOSPITAL_COMMUNITY): Payer: Self-pay

## 2021-12-03 NOTE — Telephone Encounter (Signed)
Called pt about her inhaler and let her know that we sent Rx to pharmacy with correct instructions. Stated to her that pharmacy tried filling it even with a GoodRx card and even with that, her copay was still high.  Routing this to prior auth team for review to see if there is something that is more preferred with pt's insurance.

## 2021-12-04 ENCOUNTER — Other Ambulatory Visit (HOSPITAL_COMMUNITY): Payer: Self-pay

## 2021-12-04 NOTE — Telephone Encounter (Signed)
AirDuo is the cheapest under insurance for $137.54.  The other alternatives for the ICS+LABA that are covered under the insurance are: Dulera: $366.91 Breo: $423.24  It also seems that the patient may have a deductible to meet that is contributing to the higher co-pays.

## 2021-12-05 NOTE — Telephone Encounter (Signed)
Tried routing this to [employer] but with [employer]card being out of the office, it said that Dr. Verlee Monte is covering for him. Routing to Dr. Verlee Monte for him to review.

## 2021-12-06 ENCOUNTER — Telehealth: Payer: Self-pay | Admitting: Pulmonary Disease

## 2021-12-06 MED ORDER — AIRDUO DIGIHALER 113-14 MCG/ACT IN AEPB: 1.0000 | INHALATION_SPRAY | Freq: Two times a day (BID) | RESPIRATORY_TRACT | 11 refills | Status: DC

## 2021-12-06 NOTE — Telephone Encounter (Signed)
Mychart encounter from 10/9 about pt's inhalers was sent to Dr. Verlee Monte yesterday 10/11 as he was provider of the day. Pt is needing [facility] [place]. We need to know what strength to send in.   Dr. Verlee Monte, please advise.

## 2021-12-06 NOTE — Telephone Encounter (Signed)
Airduo 113-14 1 puff twice daily [facility] [place]. Rinse mouth/gargle after each use to prevent thrush.

## 2021-12-13 ENCOUNTER — Telehealth: Payer: Self-pay | Admitting: Student

## 2021-12-13 NOTE — Telephone Encounter (Signed)
Fax received from pt's pharmacy stating that pt requests new Rx to be sent to pharmacy as AirDuo is not covered by pt's insurance. Preferred alternatives are either Breo or Dulera.  Dr. Verlee Monte, please advise.

## 2021-12-14 NOTE — Telephone Encounter (Signed)
What is out of pocket cost for LABA/ICS inhalers for her? I had assumed we had prescribed airduo with plan to use goodrx coupon.

## 2021-12-17 ENCOUNTER — Other Ambulatory Visit (HOSPITAL_COMMUNITY): Payer: Self-pay

## 2021-12-17 NOTE — Telephone Encounter (Signed)
Pt called because her insurance has denied Breo x 2 and now she is going to use her Good Rx coupon for the generic AirDuo Respiclick which is in stock at her pharmacy. [facility] [address] [facility] [place]. Please advise.

## 2021-12-17 NOTE — Telephone Encounter (Signed)
Patient states insurance will pay for the generic AirDuo Respiclick. [facility] [address] [place]. Patient phone number is [phone].

## 2021-12-18 NOTE — Telephone Encounter (Signed)
Waiting on response from Dr. Verlee Monte.

## 2021-12-18 NOTE — Telephone Encounter (Signed)
Patient calling on update for generic airduo script. Please make sure it is sent to [facility] in [place]

## 2021-12-19 MED ORDER — AIRDUO DIGIHALER 113-14 MCG/ACT IN AEPB: 1.0000 | INHALATION_SPRAY | Freq: Two times a day (BID) | RESPIRATORY_TRACT | 11 refills | Status: DC

## 2021-12-19 NOTE — Telephone Encounter (Signed)
Patient is checking on message for inhaler. Patient phone number is [phone].

## 2021-12-19 NOTE — Telephone Encounter (Signed)
This was already sent, just to the wrong pharm by Dr Verlee Monte when he was DOD  I have sent to her preferred pharm  She is aware  Nothing further needed

## 2021-12-20 ENCOUNTER — Other Ambulatory Visit: Payer: Medicare Other

## 2021-12-20 DIAGNOSIS — E7800 Pure hypercholesterolemia, unspecified: Secondary | ICD-10-CM

## 2021-12-20 DIAGNOSIS — Z136 Encounter for screening for cardiovascular disorders: Secondary | ICD-10-CM

## 2021-12-20 DIAGNOSIS — R918 Other nonspecific abnormal finding of lung field: Secondary | ICD-10-CM

## 2021-12-20 DIAGNOSIS — N958 Other specified menopausal and perimenopausal disorders: Secondary | ICD-10-CM

## 2021-12-20 DIAGNOSIS — R5383 Other fatigue: Secondary | ICD-10-CM

## 2021-12-21 ENCOUNTER — Telehealth: Payer: Self-pay | Admitting: Pulmonary Disease

## 2021-12-21 LAB — COMPLETE METABOLIC PANEL WITH GFR
AG Ratio: 2.1 (calc) (ref 1.0–2.5)
ALT: 12 U/L (ref 6–29)
AST: 16 U/L (ref 10–35)
Albumin: 4.7 g/dL (ref 3.6–5.1)
Alkaline phosphatase (APISO): 55 U/L (ref 37–153)
BUN/Creatinine Ratio: 24 (calc) — ABNORMAL HIGH (ref 6–22)
BUN: 27 mg/dL — ABNORMAL HIGH (ref 7–25)
CO2: 27 mmol/L (ref 20–32)
Calcium: 9.4 mg/dL (ref 8.6–10.4)
Chloride: 103 mmol/L (ref 98–110)
Creat: 1.11 mg/dL — ABNORMAL HIGH (ref 0.50–1.05)
Globulin: 2.2 g/dL (calc) (ref 1.9–3.7)
Glucose, Bld: 79 mg/dL (ref 65–99)
Potassium: 4.5 mmol/L (ref 3.5–5.3)
Sodium: 139 mmol/L (ref 135–146)
Total Bilirubin: 0.5 mg/dL (ref 0.2–1.2)
Total Protein: 6.9 g/dL (ref 6.1–8.1)
eGFR: 54 mL/min/{1.73_m2} — ABNORMAL LOW (ref >=60)

## 2021-12-21 LAB — CBC WITH DIFFERENTIAL/PLATELET
Absolute Monocytes: 480 cells/uL (ref 200–950)
Basophils Absolute: 51 cells/uL (ref 0–200)
Basophils Relative: 0.8 %
Eosinophils Absolute: 173 cells/uL (ref 15–500)
Eosinophils Relative: 2.7 %
HCT: 43.5 % (ref 35.0–45.0)
Hemoglobin: 14.5 g/dL (ref 11.7–15.5)
Lymphs Abs: 1242 cells/uL (ref 850–3900)
MCH: 32 pg (ref 27.0–33.0)
MCHC: 33.3 g/dL (ref 32.0–36.0)
MCV: 96 fL (ref 80.0–100.0)
MPV: 10.1 fL (ref 7.5–12.5)
Monocytes Relative: 7.5 %
Neutro Abs: 4454 cells/uL (ref 1500–7800)
Neutrophils Relative %: 69.6 %
Platelets: 278 10*3/uL (ref 140–400)
RBC: 4.53 10*6/uL (ref 3.80–5.10)
RDW: 12.5 % (ref 11.0–15.0)
Total Lymphocyte: 19.4 %
WBC: 6.4 10*3/uL (ref 3.8–10.8)

## 2021-12-21 LAB — LIPID PANEL
Cholesterol: 216 mg/dL — ABNORMAL HIGH (ref <200)
HDL: 75 mg/dL (ref >=50)
LDL Cholesterol (Calc): 122 mg/dL (calc) — ABNORMAL HIGH
Non-HDL Cholesterol (Calc): 141 mg/dL (calc) — ABNORMAL HIGH (ref <130)
Total CHOL/HDL Ratio: 2.9 (calc) (ref <5.0)
Triglycerides: 90 mg/dL (ref <150)

## 2021-12-21 LAB — TSH: TSH: 1.51 mIU/L (ref 0.40–4.50)

## 2021-12-21 MED ORDER — FLUTICASONE-SALMETEROL 113-14 MCG/ACT IN AEPB: 2.0000 | INHALATION_SPRAY | Freq: Two times a day (BID) | RESPIRATORY_TRACT | 4 refills | Status: DC

## 2021-12-21 NOTE — Telephone Encounter (Signed)
Spoke with pt and verified pt's insurance will cover AirDuo Respiclick (generic for AirDuo digihaler). [facility]iclick [facility] [place]. Pt notified. Nothing further needed at this time.

## 2021-12-28 ENCOUNTER — Other Ambulatory Visit: Payer: Medicare Other

## 2021-12-31 ENCOUNTER — Encounter: Payer: Self-pay | Admitting: Internal Medicine

## 2021-12-31 ENCOUNTER — Ambulatory Visit (INDEPENDENT_AMBULATORY_CARE_PROVIDER_SITE_OTHER): Payer: Medicare Other | Admitting: Internal Medicine

## 2021-12-31 VITALS — BP 108/70 | HR 56 | Temp 97.9°F | Ht 63.25 in | Wt 114.8 lb

## 2021-12-31 DIAGNOSIS — N3010 Interstitial cystitis (chronic) without hematuria: Secondary | ICD-10-CM | POA: Diagnosis not present

## 2021-12-31 DIAGNOSIS — R918 Other nonspecific abnormal finding of lung field: Secondary | ICD-10-CM | POA: Diagnosis not present

## 2021-12-31 DIAGNOSIS — Z0000 Encounter for general adult medical examination without abnormal findings: Secondary | ICD-10-CM | POA: Diagnosis not present

## 2021-12-31 DIAGNOSIS — M8580 Other specified disorders of bone density and structure, unspecified site: Secondary | ICD-10-CM | POA: Diagnosis not present

## 2021-12-31 LAB — POCT URINALYSIS DIPSTICK
Bilirubin, UA: NEGATIVE
Blood, UA: NEGATIVE
Glucose, UA: NEGATIVE
Ketones, UA: NEGATIVE
Leukocytes, UA: NEGATIVE
Nitrite, UA: NEGATIVE
Protein, UA: NEGATIVE
Spec Grav, UA: 1.015 (ref 1.010–1.025)
Urobilinogen, UA: 0.2 E.U./dL
pH, UA: 5 (ref 5.0–8.0)

## 2021-12-31 NOTE — Progress Notes (Signed)
Annual Wellness Visit     Patient: Jennifer Reyes, Female    DOB: [birth year], 66 y.o.   MRN: [id number] Visit Date: 12/31/2021   Subjective    Jennifer Reyes is a 66 y.o. Female who presents today for her Annual Wellness Visit.  HPI She also presents for health maintenance exam and evaluation of medical issues.  She is allergic to bee stings.  She is allergic to penicillin-it causes hives and rash.  She is due for colonoscopy.  Dr. Earlean Shawl retired.  Patient will let me know when she would like to be referred and to which practice.  History of mild osteopenia followed by bone density study.  No recent bone density study on file and 1 has been ordered.  Has been seen by Dr. Valeta Harms regarding cough and pulmonary nodules.  Repeat CT in September 2023 indicated there continues to be some nodularity in the periphery of the lower lobes of the lungs bilaterally but had regressed compared to prior study.  Benign etiology was favored.  Recommended CT scan again in 12 months if patient is high risk.  She has a history of interstitial cystitis treated by Dr. Amalia Hailey.  Evaluated for palpitations in 1991.  History of frozen right shoulder and frozen left shoulder in the remote past.  Tonsillectomy 1961.  Pilonidal cyst 1974.  Has undergone multiple urethral dilatations.  D&C for miscarriage 1995.  Anal fissure repair with Clorpactin treatment January 1994.  Hydrodilatation of bladder and cystoscopy August 1997.  Social history: She is married.  1 adult son.  20 grandchildren.  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  She is a non-smoker.  Family history: Father passed away in April 8676 with complications of heart disease.  He was status post coronary artery bypass surgery and had hypertension.  Mother passed away in Dec 08, 2017 with history of COPD.  She has 3 sisters, 1 of whom has Mnire's disease.  Grandmother with history of Mnire's disease.  Patient  had COVID in June 2022.  She had protracted maxillary sinus issues and was seen by video visit in late June.  Was treated at that time with Zithromax and prednisone.  In January 2022 she had CT coronary calcium scoring and result was 0.  However there was nodular thickening noted in right base and repeat CT in August showed interval development of multiple pulmonary nodules bilaterally.  She had a PET scan and QuantiFERON test.  QuantiFERON test was negative.  She saw Dr. Estanislado Spire.  Bronchoscopy was performed which proved to be negative.  Patient has had chronic cough for over 25 years.  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  Bronchoscopy sample showed no evidence of malignancy cultures including AFB were negative.  Results of repeat CT in September 2023 indicates improvement of nodularity.        Patient Care Team: Elby Showers, MD as PCP - General (Internal Medicine)  Review of Systems feels well in general.  Tries to get some light exercise.  Is living in [place] and also travels to [place] to stay with grandchildren and help out   Objective    Vitals: Blood pressure 108/70, pulse 56 and regular, temperature 97.9, pulse oximetry 97 percent on room air, BMI 20.18, weight is 114 pounds 12.8 ounces, height 5 feet 3.25 inches  Physical Exam Skin: Warm and dry.  No cervical adenopathy, thyromegaly or  carotid bruits.  Chest is clear to auscultation without rales or wheezing.  Cardiac exam: Regular rate and rhythm without ectopy.  Breasts are without masses.  Abdomen is soft nondistended without hepatosplenomegaly masses or tenderness.  No lower extremity edema.  Neuro is intact without gross focal deficits.  Affect, thought, and judgment are normal.  Most recent functional status assessment:    12/31/2021   11:05 AM  In your present state of health, do you have any difficulty performing the following activities:  Hearing? 0  Vision? 0  Difficulty  concentrating or making decisions? 0  Walking or climbing stairs? 0  Dressing or bathing? 0  Doing errands, shopping? 0  Preparing Food and eating ? N  Using the Toilet? N  In the past six months, have you accidently leaked urine? N  Do you have problems with loss of bowel control? N  Managing your Medications? N  Managing your Finances? N  Housekeeping or managing your Housekeeping? N   Most recent fall risk assessment:    12/31/2021   11:04 AM  Fall Risk   Falls in the past year? 0  Number falls in past yr: 0  Injury with Fall? 0  Risk for fall due to : No Fall Risks  Follow up Falls evaluation completed    Most recent depression screenings:    12/31/2021   11:04 AM 12/28/2020   11:19 AM  PHQ 2/9 Scores  PHQ - 2 Score 0 0   Most recent cognitive screening:    12/31/2021   11:06 AM  6CIT Screen  What Year? 0 points  What month? 0 points  What time? 0 points  Count back from 20 0 points  Months in reverse 0 points  Repeat phrase 0 points  Total Score 0 points       Assessment & Plan   History of pulmonary nodules currently being followed by Dr. Tonia Brooms and recent CT showed them to be improved favoring benign etiology  History of COVID-19 in June 2022  Elevated LDL cholesterol of 122-continue to work on diet and exercise.  Patient prefers to hold off on statin therapy at the present time.  Previous coronary calcium score in January 2022 was 0.   Plan: Continue diet and exercise regimen.  Vaccinations discussed.  Return in 1 year or as needed.      Annual wellness visit done today including the all of the following: Reviewed patient's Family Medical History Reviewed and updated list of patient's medical providers Assessment of cognitive impairment was done Assessed patient's functional ability Established a written schedule for health screening services Health Risk Assessent Completed and Reviewed  Discussed health benefits of physical activity, and  encouraged her to engage in regular exercise appropriate for her age and condition.         IMargaree Mackintosh, MD, have reviewed all documentation for this visit. The documentation on 01/20/22 for the exam, diagnosis, procedures, and orders are all accurate and complete.   LaVon Philipp Deputy, CMA

## 2022-01-16 ENCOUNTER — Other Ambulatory Visit: Payer: Medicare Other

## 2022-01-29 ENCOUNTER — Other Ambulatory Visit: Payer: Self-pay | Admitting: Internal Medicine

## 2022-01-29 DIAGNOSIS — Z1231 Encounter for screening mammogram for malignant neoplasm of breast: Secondary | ICD-10-CM

## 2022-02-06 ENCOUNTER — Ambulatory Visit (INDEPENDENT_AMBULATORY_CARE_PROVIDER_SITE_OTHER): Payer: Medicare Other

## 2022-02-06 DIAGNOSIS — M8580 Other specified disorders of bone density and structure, unspecified site: Secondary | ICD-10-CM | POA: Diagnosis not present

## 2022-02-06 DIAGNOSIS — Z1231 Encounter for screening mammogram for malignant neoplasm of breast: Secondary | ICD-10-CM

## 2022-02-06 DIAGNOSIS — N958 Other specified menopausal and perimenopausal disorders: Secondary | ICD-10-CM

## 2022-02-08 LAB — HM MAMMOGRAPHY

## 2022-06-03 ENCOUNTER — Other Ambulatory Visit: Payer: Medicare Other

## 2022-06-03 DIAGNOSIS — R7302 Impaired glucose tolerance (oral): Secondary | ICD-10-CM

## 2022-06-03 DIAGNOSIS — Z13220 Encounter for screening for lipoid disorders: Secondary | ICD-10-CM

## 2022-06-04 LAB — LIPID PANEL
Cholesterol: 209 mg/dL — ABNORMAL HIGH (ref <200)
HDL: 66 mg/dL (ref >=50)
LDL Cholesterol (Calc): 122 mg/dL (calc) — ABNORMAL HIGH
Non-HDL Cholesterol (Calc): 143 mg/dL (calc) — ABNORMAL HIGH (ref <130)
Total CHOL/HDL Ratio: 3.2 (calc) (ref <5.0)
Triglycerides: 99 mg/dL (ref <150)

## 2022-06-04 LAB — HEPATIC FUNCTION PANEL
AG Ratio: 2.1 (calc) (ref 1.0–2.5)
ALT: 13 U/L (ref 6–29)
AST: 16 U/L (ref 10–35)
Albumin: 4.4 g/dL (ref 3.6–5.1)
Alkaline phosphatase (APISO): 51 U/L (ref 37–153)
Bilirubin, Direct: 0.1 mg/dL (ref 0.0–0.2)
Globulin: 2.1 g/dL (calc) (ref 1.9–3.7)
Indirect Bilirubin: 0.5 mg/dL (calc) (ref 0.2–1.2)
Total Bilirubin: 0.6 mg/dL (ref 0.2–1.2)
Total Protein: 6.5 g/dL (ref 6.1–8.1)

## 2022-06-04 LAB — HEMOGLOBIN A1C
Hgb A1c MFr Bld: 5.7 % of total Hgb — ABNORMAL HIGH (ref <5.7)
Mean Plasma Glucose: 117 mg/dL
eAG (mmol/L): 6.5 mmol/L

## 2022-06-25 ENCOUNTER — Other Ambulatory Visit: Payer: Medicare Other

## 2022-07-01 ENCOUNTER — Other Ambulatory Visit: Payer: Medicare Other

## 2022-07-29 LAB — COLOGUARD: Cologuard: NEGATIVE

## 2022-08-02 ENCOUNTER — Encounter: Payer: Self-pay | Admitting: Internal Medicine

## 2022-08-02 LAB — COLOGUARD: COLOGUARD: NEGATIVE

## 2022-08-23 ENCOUNTER — Telehealth: Payer: Self-pay | Admitting: Internal Medicine

## 2022-08-23 ENCOUNTER — Telehealth: Payer: Self-pay

## 2022-08-23 MED ORDER — ACYCLOVIR 5 % EX OINT: 1.0000 | TOPICAL_OINTMENT | CUTANEOUS | 0 refills | Status: AC

## 2022-08-23 NOTE — Telephone Encounter (Signed)
Per cover my meds patient needs prior auth on acyclovir 5% ointment. Key [id number]

## 2022-08-23 NOTE — Telephone Encounter (Signed)
Patient called and requested a refill on acyclovir ointment (ZOVIRAX) 5 % and asked for the refill to be sent to [facility] [id number] - [place], [place] - [address]

## 2022-08-23 NOTE — Telephone Encounter (Signed)
Refill sent as requested. 

## 2022-08-26 ENCOUNTER — Other Ambulatory Visit (HOSPITAL_COMMUNITY): Payer: Self-pay

## 2022-08-26 NOTE — Telephone Encounter (Signed)
Initiated PA via CMM. Please advise the diagnosis.

## 2022-08-27 ENCOUNTER — Telehealth: Payer: Self-pay

## 2022-08-27 NOTE — Telephone Encounter (Signed)
Patient has been notified to use good rx and pay $26.00 out of pocket. She has agreed.

## 2022-08-27 NOTE — Telephone Encounter (Signed)
Pharmacy Patient Advocate Encounter   Received notification from [name] that prior authorization for Acyclovir 5% ointment is required/requested.   PA submitted to [facility] via CoverMyMeds [facility] or [facility]) confirmation # [id number] Status is pending

## 2022-08-27 NOTE — Telephone Encounter (Signed)
PA submitted via CMM. Created new encounter for PA. Will route back to pool once determination has been made. 

## 2022-08-27 NOTE — Telephone Encounter (Signed)
Pharmacy Patient Advocate Encounter  Received notification from [employer] that the request for prior authorization for Acyclovir Oin 5% has been denied due to Fever blister;cold sore is not an FDA approved medical condition.       Please be advised we currently do not have a Pharmacist to review denials, therefore you will need to process appeals accordingly as needed. Thanks for your support at this time.   You may call [phone] or fax [phone], to appeal.

## 2022-09-02 ENCOUNTER — Encounter: Payer: Self-pay | Admitting: Internal Medicine

## 2022-09-26 IMAGING — CT CT CARDIAC CORONARY ARTERY CALCIUM SCORE
3 series · 14 of 20 positions shown, 15 images · non-contrast
Comparison: None.
COMPARISON: None.

Addendum:
EXAM:
OVER-READ INTERPRETATION  CT CHEST

The following report is an over-read performed by radiologist Dr.
over-read does not include interpretation of cardiac or coronary
anatomy or pathology. The coronary calcium score interpretation by
the cardiologist is attached.
CLINICAL DATA: Risk stratification
Coronary Calcium Score
TECHNIQUE: The patient was scanned on a Siemens Somatom 64 slice scanner. Axial
non-contrast 3 mm slices were carried out through the heart. The
data set was analyzed on a dedicated work station and scored using
the Agatson method.

[Series 2: casc 3.0 bv41 2 bestdiast 69 % · axial · 0.32mm/px · z∈[-241,-166]mm · 4 of 43 slices shown, 5 images]
[im 9/43  vessel]
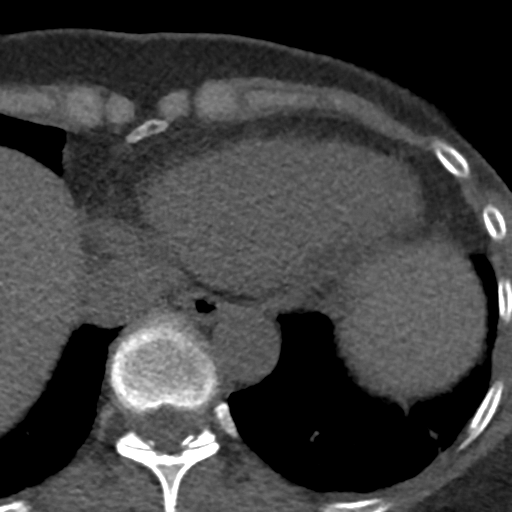
[im 9/43  lung]
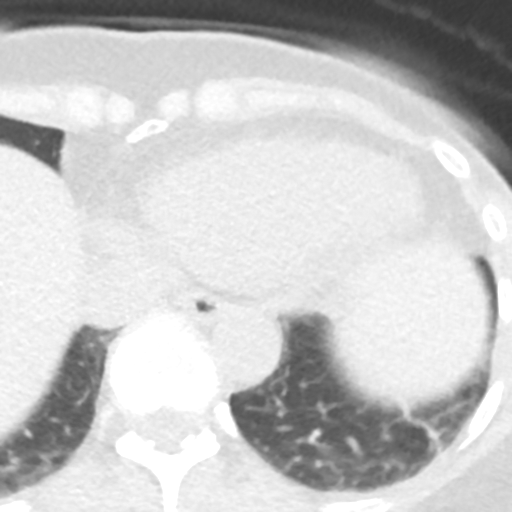
[im 17/43  vessel]
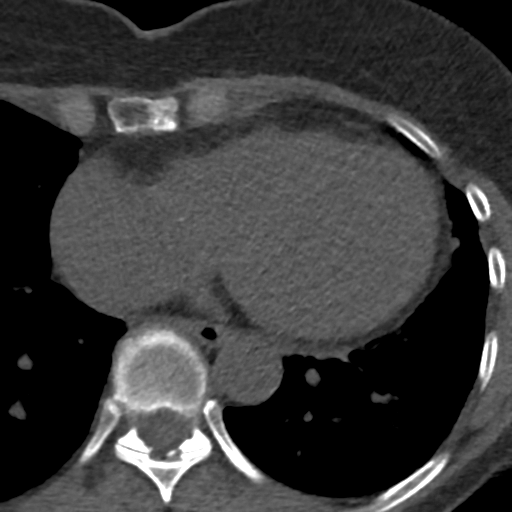
[im 26/43  vessel]
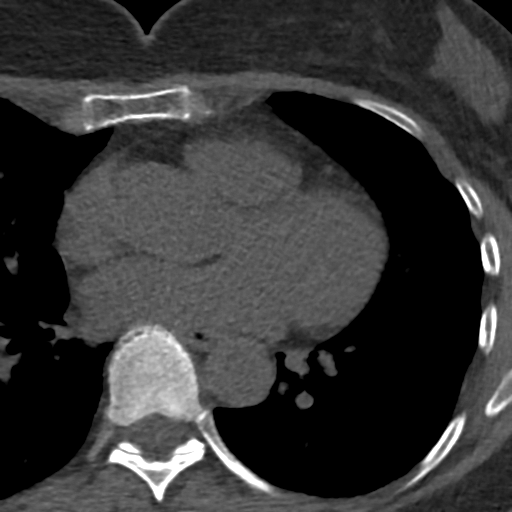
[im 34/43  vessel]
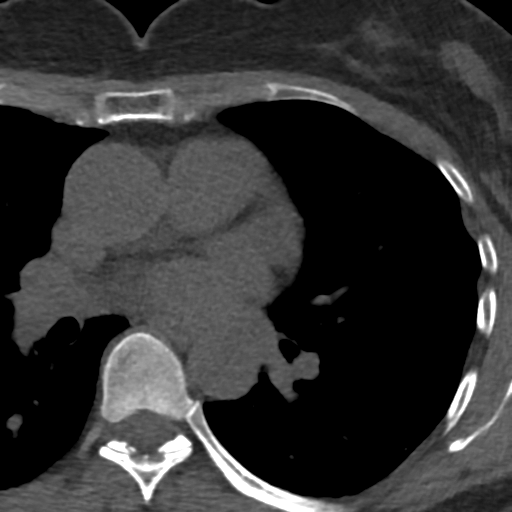

[Series 3: lung 69 % · axial · 0.58mm/px · z∈[-244,-160]mm · 5 of 43 slices shown]
[im 8/43  lung]
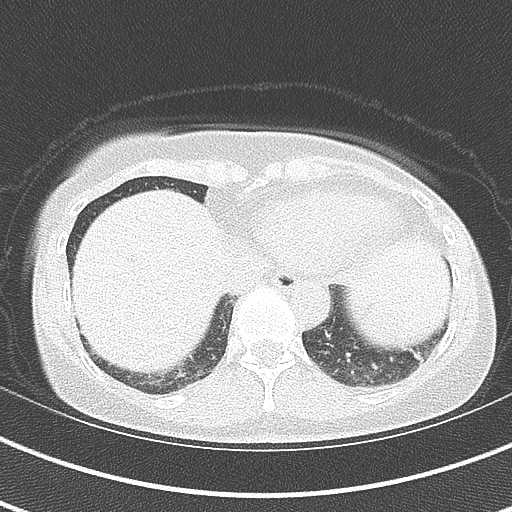
[im 15/43  lung]
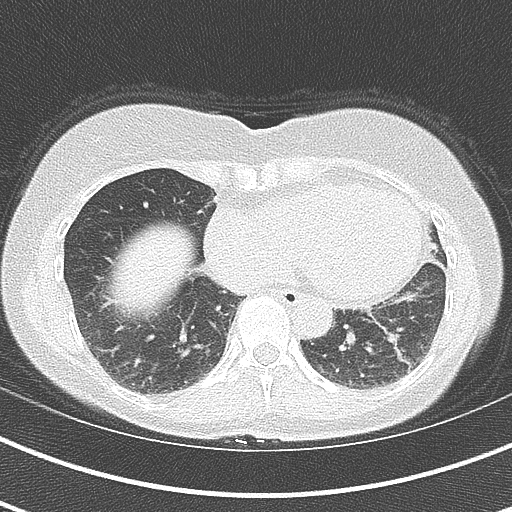
[im 22/43  lung]
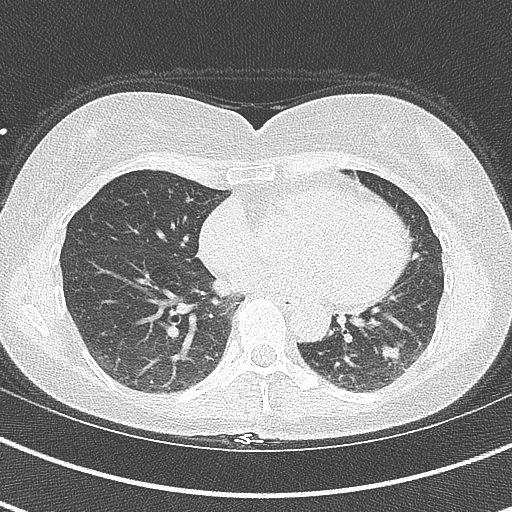
[im 29/43  lung]
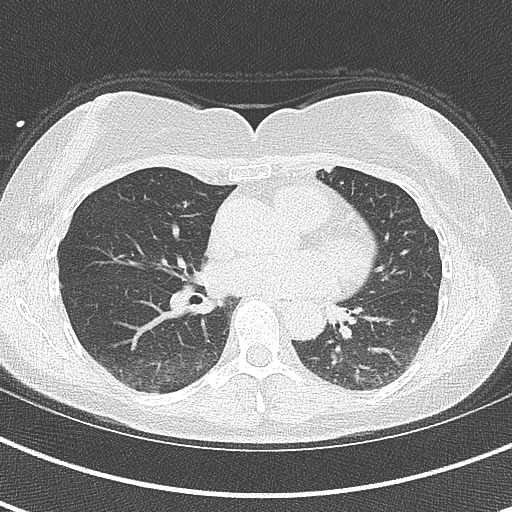
[im 36/43  lung]
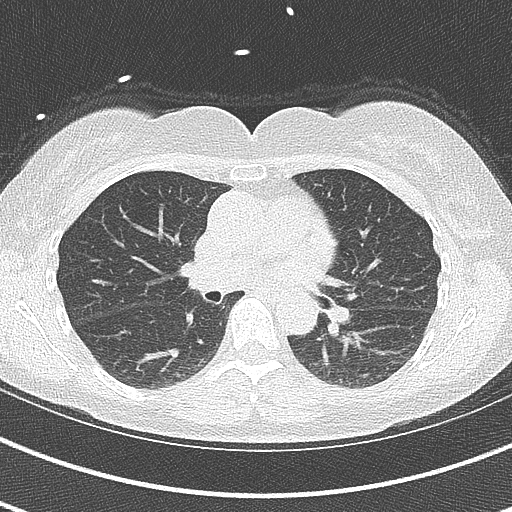

[Series 4: lung st 69 % · axial · 0.58mm/px · z∈[-244,-160]mm · 5 of 43 slices shown]
[im 8/43  lung]
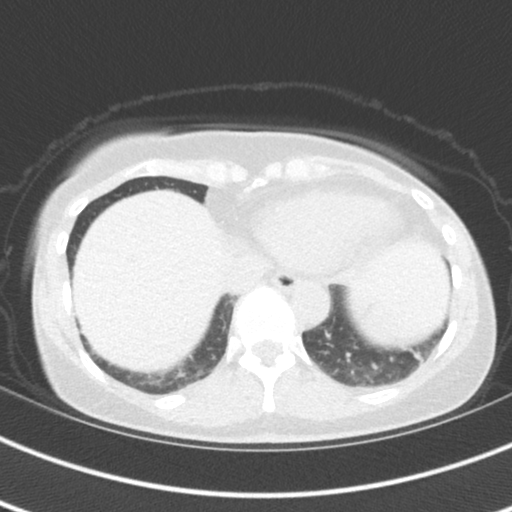
[im 15/43  lung]
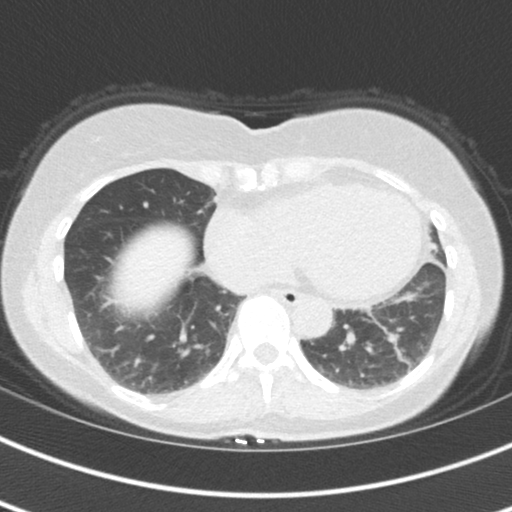
[im 22/43  lung]
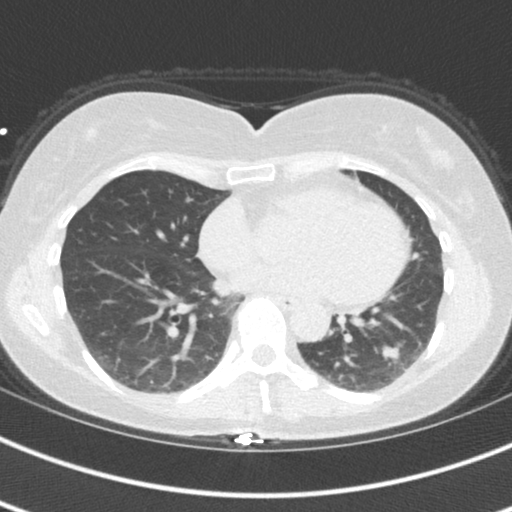
[im 29/43  lung]
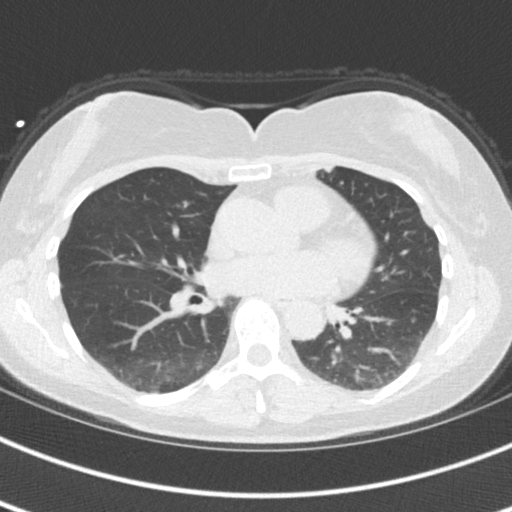
[im 36/43  lung]
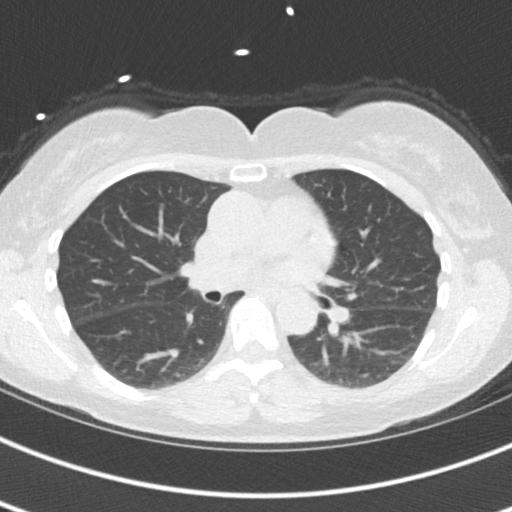

[14 of 20 positions shown; findings below may reference images not displayed]

FINDINGS: Limited view of the lung parenchyma demonstrates no suspicious
nodularity. Airways are normal.

Limited view of the mediastinum demonstrates no adenopathy.
Esophagus normal.

Limited view of the upper abdomen unremarkable.

Limited view of the skeleton and chest wall is unremarkable.
IMPRESSION: Nodular thickening at the RIGHT lung base. Non-contrast chest CT at
6-12 months is recommended. If the nodule is stable at time of
repeat CT, then future CT at 18-24 months (from today's scan) is
considered optional for low-risk patients, but is recommended for
high-risk patients. This recommendation follows the consensus
statement: Guidelines for Management of Incidental Pulmonary Nodules
Detected on CT Images: From the [HOSPITAL] 9824; Radiology
FINDINGS: Non-cardiac: See separate report from [REDACTED].

Ascending aorta: Normal aortic root 3.1 cm

Pericardium: Normal

Coronary arteries: No calcium noted
IMPRESSION: Coronary calcium score of 0.

Oagile Black John Pizzy

*** End of Addendum ***
EXAM:
OVER-READ INTERPRETATION  CT CHEST

The following report is an over-read performed by radiologist Dr.
over-read does not include interpretation of cardiac or coronary
anatomy or pathology. The coronary calcium score interpretation by
the cardiologist is attached.
FINDINGS: Limited view of the lung parenchyma demonstrates no suspicious
nodularity. Airways are normal.

Limited view of the mediastinum demonstrates no adenopathy.
Esophagus normal.

Limited view of the upper abdomen unremarkable.

Limited view of the skeleton and chest wall is unremarkable.
IMPRESSION: Nodular thickening at the RIGHT lung base. Non-contrast chest CT at
6-12 months is recommended. If the nodule is stable at time of
repeat CT, then future CT at 18-24 months (from today's scan) is
considered optional for low-risk patients, but is recommended for
high-risk patients. This recommendation follows the consensus
statement: Guidelines for Management of Incidental Pulmonary Nodules
Detected on CT Images: From the [HOSPITAL] 9824; Radiology

## 2022-12-19 ENCOUNTER — Telehealth: Payer: Self-pay | Admitting: Internal Medicine

## 2022-12-19 NOTE — Telephone Encounter (Signed)
Patient called and lvm saying she needed to discuss insomnia with Dr Lenord Fellers and was wondering if she can just discuss at her annual visit on 01/02/23. Please advise, Is this fine or does patient need separate visit?

## 2022-12-23 ENCOUNTER — Other Ambulatory Visit: Payer: Medicare Other

## 2022-12-23 DIAGNOSIS — R7302 Impaired glucose tolerance (oral): Secondary | ICD-10-CM

## 2022-12-23 DIAGNOSIS — Z0000 Encounter for general adult medical examination without abnormal findings: Secondary | ICD-10-CM

## 2022-12-23 DIAGNOSIS — Z13220 Encounter for screening for lipoid disorders: Secondary | ICD-10-CM

## 2022-12-23 DIAGNOSIS — Z1329 Encounter for screening for other suspected endocrine disorder: Secondary | ICD-10-CM

## 2022-12-23 DIAGNOSIS — M8580 Other specified disorders of bone density and structure, unspecified site: Secondary | ICD-10-CM

## 2022-12-23 DIAGNOSIS — E785 Hyperlipidemia, unspecified: Secondary | ICD-10-CM

## 2022-12-23 NOTE — Addendum Note (Signed)
Addended by: Gregery Na on: 12/23/2022 10:23 AM   Modules accepted: Orders

## 2022-12-24 LAB — TSH: TSH: 1.69 m[IU]/L (ref 0.40–4.50)

## 2022-12-24 LAB — COMPLETE METABOLIC PANEL WITH GFR
AG Ratio: 2.2 (calc) (ref 1.0–2.5)
ALT: 11 U/L (ref 6–29)
AST: 15 U/L (ref 10–35)
Albumin: 4.3 g/dL (ref 3.6–5.1)
Alkaline phosphatase (APISO): 53 U/L (ref 37–153)
BUN/Creatinine Ratio: 16 (calc) (ref 6–22)
BUN: 18 mg/dL (ref 7–25)
CO2: 21 mmol/L (ref 20–32)
Calcium: 9 mg/dL (ref 8.6–10.4)
Chloride: 106 mmol/L (ref 98–110)
Creat: 1.11 mg/dL — ABNORMAL HIGH (ref 0.50–1.05)
Globulin: 2 g/dL (ref 1.9–3.7)
Glucose, Bld: 89 mg/dL (ref 65–99)
Potassium: 4.3 mmol/L (ref 3.5–5.3)
Sodium: 139 mmol/L (ref 135–146)
Total Bilirubin: 0.5 mg/dL (ref 0.2–1.2)
Total Protein: 6.3 g/dL (ref 6.1–8.1)
eGFR: 54 mL/min/{1.73_m2} — ABNORMAL LOW (ref >=60)

## 2022-12-24 LAB — CBC WITH DIFFERENTIAL/PLATELET
Absolute Lymphocytes: 1181 {cells}/uL (ref 850–3900)
Absolute Monocytes: 574 {cells}/uL (ref 200–950)
Basophils Absolute: 59 {cells}/uL (ref 0–200)
Basophils Relative: 0.9 %
Eosinophils Absolute: 152 {cells}/uL (ref 15–500)
Eosinophils Relative: 2.3 %
HCT: 40 % (ref 35.0–45.0)
Hemoglobin: 13.1 g/dL (ref 11.7–15.5)
MCH: 32.2 pg (ref 27.0–33.0)
MCHC: 32.8 g/dL (ref 32.0–36.0)
MCV: 98.3 fL (ref 80.0–100.0)
MPV: 10.2 fL (ref 7.5–12.5)
Monocytes Relative: 8.7 %
Neutro Abs: 4633 {cells}/uL (ref 1500–7800)
Neutrophils Relative %: 70.2 %
Platelets: 276 10*3/uL (ref 140–400)
RBC: 4.07 10*6/uL (ref 3.80–5.10)
RDW: 12.1 % (ref 11.0–15.0)
Total Lymphocyte: 17.9 %
WBC: 6.6 10*3/uL (ref 3.8–10.8)

## 2022-12-24 LAB — LIPID PANEL
Cholesterol: 189 mg/dL (ref <200)
HDL: 67 mg/dL (ref >=50)
LDL Cholesterol (Calc): 102 mg/dL — ABNORMAL HIGH
Non-HDL Cholesterol (Calc): 122 mg/dL (ref <130)
Total CHOL/HDL Ratio: 2.8 (calc) (ref <5.0)
Triglycerides: 108 mg/dL (ref <150)

## 2022-12-24 LAB — HEMOGLOBIN A1C
Hgb A1c MFr Bld: 5.7 %{Hb} — ABNORMAL HIGH (ref <5.7)
Mean Plasma Glucose: 117 mg/dL
eAG (mmol/L): 6.5 mmol/L

## 2022-12-26 NOTE — Progress Notes (Signed)
Annual Wellness Visit    Patient Care Team: Margaree Mackintosh, MD as PCP - General (Internal Medicine)  Visit Date: 01/02/23   Chief Complaint  Patient presents with   Medicare Wellness   Annual Exam    Subjective:   Patient: Jennifer Reyes, Female    DOB: Aug 12, 1955, 66 y.o.   MRN: [id number]  Jennifer Reyes is a 67 y.o. Female who presents today for her Annual Wellness Visit. History of hyperlipidemia, interstitial cystitis, nocturia.  History of mild impaired glucose intolerance. HGBA1c at 5.7% on 12/23/22, no change from seven months ago.  History of interstitial cystitis and UTIs and followed by urologist, Dr. Marcelyn Bruins. She has had [place] lisa procedure which helped with vaginal atrophy and dyspareunia. She is on Elmiron plus prn OAB meds, Percocet as needed, trazodone at bedtime for sleep.   History of lung nodules and was seen by Dr. Audie Box on 11/28/21. 11/22/21 CT chest showed: 1) overall, compared to the prior examination, the majority of the previously noted basilar pulmonary nodules have regressed, indicative of a benign etiology. There are some new nodules measuring 5 mm or less in size, nonspecific, but statistically likely benign, 2) Aortic atherosclerosis. Repeat CT chest recommended in 2025.  History of mild osteopenia. 02/06/22 bone density scan showed T-score -1.9 at right femur neck.  Evaluated for palpitations in 1991. History of frozen right shoulder and frozen left shoulder in the remote past. Tonsillectomy 1961. Pilonidal cyst 1974. Has undergone multiple urethral dilatations. D&C for miscarriage 1995. Anal fissure repair with Clorpactin treatment January 1994. Hydrodilatation of bladder and cystoscopy August 1997.   Patient had COVID in June 2022.  She had protracted maxillary sinus issues and was seen by video visit in late June.  Was treated at that time with Zithromax and prednisone.   Labs reviewed today. Glucose normal. Creatinine elevated at  1.11, GFR low at 54. LDL slightly elevated at 102. CBC normal. TSH at 1.69.  Mammogram normal on 02/11/22.  Patient had colonoscopy in 2014 by Dr. Kinnie Scales with 10-year follow-up recommended.   In January 2022 she had CT coronary calcium scoring and result was 0.   Social history: She is married.  1 adult son.  20 grandchildren.  Husband is a retired Biochemist, clinical.  She has a BS degree in Tree surgeon.  She is a non-smoker.   Family history: Father passed away in May 15, 2019with complications of heart disease.  He was status post coronary artery bypass surgery and had hypertension.  Mother passed away in November 14, 2019with history of COPD.  She has 3 sisters, 1 of whom has Mnire's disease.  Grandmother with history of Mnire's disease.  Past Medical History:  Diagnosis Date   Bladder pain    Frequency of urination    Hyperlipidemia    Interstitial cystitis    Nocturia    PONV (postoperative nausea and vomiting)    Urgency of urination      Family History  Problem Relation Age of Onset   Heart disease Father    Hypertension Father          Review of Systems  Constitutional:  Negative for chills, fever, malaise/fatigue and weight loss.  HENT:  Negative for hearing loss, sinus pain and sore throat.   Respiratory:  Negative for cough, hemoptysis and shortness of breath.   Cardiovascular:  Negative for chest pain, palpitations, leg swelling and PND.  Gastrointestinal:  Negative for abdominal pain, constipation, diarrhea, heartburn,  nausea and vomiting.  Genitourinary:  Negative for dysuria, frequency and urgency.  Musculoskeletal:  Negative for back pain, myalgias and neck pain.  Skin:  Negative for itching and rash.  Neurological:  Negative for dizziness, tingling, seizures and headaches.  Endo/Heme/Allergies:  Negative for polydipsia.  Psychiatric/Behavioral:  Negative for depression. The patient has insomnia. The patient is not nervous/anxious.       Objective:    Vitals: BP 120/80   Pulse 70   Ht 5' 3.25" (1.607 m)   Wt 110 lb (49.9 kg)   SpO2 96%   BMI 19.33 kg/m   Physical Exam Vitals and nursing note reviewed.  Constitutional:      General: She is not in acute distress.    Appearance: Normal appearance. She is not ill-appearing or toxic-appearing.  HENT:     Head: Normocephalic and atraumatic.     Right Ear: Hearing, tympanic membrane, ear canal and external ear normal.     Left Ear: Hearing, tympanic membrane, ear canal and external ear normal.     Mouth/Throat:     Pharynx: Oropharynx is clear.  Eyes:     Extraocular Movements: Extraocular movements intact.     Pupils: Pupils are equal, round, and reactive to light.  Neck:     Thyroid: No thyroid mass, thyromegaly or thyroid tenderness.     Vascular: No carotid bruit.  Cardiovascular:     Rate and Rhythm: Normal rate and regular rhythm. No extrasystoles are present.    Pulses:          Dorsalis pedis pulses are 1+ on the right side and 1+ on the left side.     Heart sounds: Normal heart sounds. No murmur heard.    No friction rub. No gallop.  Pulmonary:     Effort: Pulmonary effort is normal.     Breath sounds: Normal breath sounds. No decreased breath sounds, wheezing, rhonchi or rales.  Chest:     Chest wall: No mass.  Breasts:    Right: No mass.     Left: No mass.  Abdominal:     Palpations: Abdomen is soft. There is no hepatomegaly, splenomegaly or mass.     Tenderness: There is no abdominal tenderness.     Hernia: No hernia is present.  Musculoskeletal:     Cervical back: Normal range of motion.     Right lower leg: No edema.     Left lower leg: No edema.  Lymphadenopathy:     Cervical: No cervical adenopathy.     Upper Body:     Right upper body: No supraclavicular adenopathy.     Left upper body: No supraclavicular adenopathy.  Skin:    General: Skin is warm and dry.  Neurological:     General: No focal deficit present.     Mental Status: She is alert  and oriented to person, place, and time. Mental status is at baseline.     Sensory: Sensation is intact.     Motor: Motor function is intact. No weakness.     Deep Tendon Reflexes: Reflexes are normal and symmetric.  Psychiatric:        Attention and Perception: Attention normal.        Mood and Affect: Mood normal.        Speech: Speech normal.        Behavior: Behavior normal.        Thought Content: Thought content normal.        Cognition and Memory:  Cognition normal.        Judgment: Judgment normal.      Most recent functional status assessment:    01/02/2023   10:48 AM  In your present state of health, do you have any difficulty performing the following activities:  Hearing? 0  Vision? 0  Difficulty concentrating or making decisions? 0  Walking or climbing stairs? 0  Dressing or bathing? 0  Doing errands, shopping? 0  Preparing Food and eating ? N  Using the Toilet? N  In the past six months, have you accidently leaked urine? N  Do you have problems with loss of bowel control? N  Managing your Medications? N  Managing your Finances? N  Housekeeping or managing your Housekeeping? N   Most recent fall risk assessment:    01/02/2023   10:56 AM  Fall Risk   Falls in the past year? 0  Number falls in past yr: 0  Injury with Fall? 0  Risk for fall due to : No Fall Risks  Follow up Falls evaluation completed;Education provided;Falls prevention discussed    Most recent depression screenings:    01/02/2023   10:57 AM 12/31/2021   11:04 AM  PHQ 2/9 Scores  PHQ - 2 Score 0 0   Most recent cognitive screening:    01/02/2023   10:57 AM  6CIT Screen  What Year? 0 points  What month? 0 points  What time? 0 points  Count back from 20 0 points  Months in reverse 0 points  Repeat phrase 0 points  Total Score 0 points     Results:   Studies obtained and personally reviewed by me:  Mammogram normal on 02/11/22.  Patient had colonoscopy in 2014 by Dr. Kinnie Scales  with 10-year follow-up recommended.   In January 2022 she had CT coronary calcium scoring and result was 0.   Labs:       Component Value Date/Time   NA 139 12/23/2022 1020   K 4.3 12/23/2022 1020   CL 106 12/23/2022 1020   CO2 21 12/23/2022 1020   GLUCOSE 89 12/23/2022 1020   BUN 18 12/23/2022 1020   CREATININE 1.11 (H) 12/23/2022 1020   CALCIUM 9.0 12/23/2022 1020   PROT 6.3 12/23/2022 1020   ALBUMIN 4.7 07/16/2011 0932   AST 15 12/23/2022 1020   ALT 11 12/23/2022 1020   ALKPHOS 45 07/16/2011 0932   BILITOT 0.5 12/23/2022 1020   GFRNONAA 50 (L) 12/09/2019 0938   GFRAA 58 (L) 12/09/2019 0938     Lab Results  Component Value Date   WBC 6.6 12/23/2022   HGB 13.1 12/23/2022   HCT 40.0 12/23/2022   MCV 98.3 12/23/2022   PLT 276 12/23/2022    Lab Results  Component Value Date   CHOL 189 12/23/2022   HDL 67 12/23/2022   LDLCALC 102 (H) 12/23/2022   TRIG 108 12/23/2022   CHOLHDL 2.8 12/23/2022    Lab Results  Component Value Date   HGBA1C 5.7 (H) 12/23/2022     Lab Results  Component Value Date   TSH 1.69 12/23/2022    Assessment & Plan:   Mild impaired glucose tolerance: diet and exercise controlled. HGBA1c at 5.7% on 12/23/22, no change from seven months ago.  Insomnia: prescribed zolpidem 5 mg at bedtime.  Hx of lung nodules and seen by Dr. Audie Box on 11/28/21. Repeat CT chest recommended in 2025.  Hx of mild osteopenia followed by bone density study. 02/06/22 bone density scan showed T-score -1.9  at right femur neck.  Hx of COVID-19 in June 2022.  Pelvic exam deferred to GYN physician.  Mammogram normal on 02/11/22.  Patient had colonoscopy in 2014 by Dr. Kinnie Scales with 10-year follow-up recommended.   In January 2022 she had CT coronary calcium scoring and result was 0.   Urinalysis normal today. Urine culture ordered at patient's request.  Vaccine counseling: UTD on tetanus, pneumococcal-20 vaccines.  Return in 03/2023 for A1c  recheck.     Annual wellness visit done today including the all of the following: Reviewed patient's Family Medical History Reviewed and updated list of patient's medical providers Assessment of cognitive impairment was done Assessed patient's functional ability Established a written schedule for health screening services Health Risk Assessent Completed and Reviewed  Discussed health benefits of physical activity, and encouraged her to engage in regular exercise appropriate for her age and condition.        I,Alexander Ruley,acting as a Neurosurgeon for Margaree Mackintosh, MD.,have documented all relevant documentation on the behalf of Margaree Mackintosh, MD,as directed by  Margaree Mackintosh, MD while in the presence of Margaree Mackintosh, MD.   I, Margaree Mackintosh, MD, have reviewed all documentation for this visit. The documentation on 01/12/23 for the exam, diagnosis, procedures, and orders are all accurate and complete.

## 2022-12-30 ENCOUNTER — Other Ambulatory Visit: Payer: Medicare Other

## 2023-01-02 ENCOUNTER — Ambulatory Visit: Payer: Medicare Other | Admitting: Internal Medicine

## 2023-01-02 ENCOUNTER — Encounter: Payer: Self-pay | Admitting: Internal Medicine

## 2023-01-02 VITALS — BP 120/80 | HR 70 | Ht 63.25 in | Wt 110.0 lb

## 2023-01-02 DIAGNOSIS — R3989 Other symptoms and signs involving the genitourinary system: Secondary | ICD-10-CM | POA: Diagnosis not present

## 2023-01-02 DIAGNOSIS — N3010 Interstitial cystitis (chronic) without hematuria: Secondary | ICD-10-CM | POA: Diagnosis not present

## 2023-01-02 DIAGNOSIS — M8580 Other specified disorders of bone density and structure, unspecified site: Secondary | ICD-10-CM

## 2023-01-02 DIAGNOSIS — R918 Other nonspecific abnormal finding of lung field: Secondary | ICD-10-CM | POA: Diagnosis not present

## 2023-01-02 DIAGNOSIS — Z0000 Encounter for general adult medical examination without abnormal findings: Secondary | ICD-10-CM

## 2023-01-02 LAB — POCT URINALYSIS DIP (CLINITEK)
Bilirubin, UA: NEGATIVE
Blood, UA: NEGATIVE
Glucose, UA: NEGATIVE mg/dL
Ketones, POC UA: NEGATIVE mg/dL
Leukocytes, UA: NEGATIVE
Nitrite, UA: NEGATIVE
POC PROTEIN,UA: NEGATIVE
Spec Grav, UA: 1.01
Urobilinogen, UA: 0.2 U/dL
pH, UA: 7

## 2023-01-02 MED ORDER — ZOLPIDEM TARTRATE 5 MG PO TABS: 5.0000 mg | ORAL_TABLET | Freq: Every day | ORAL | 1 refills | Status: DC

## 2023-01-03 LAB — URINE CULTURE
MICRO NUMBER:: 15699954
SPECIMEN QUALITY:: ADEQUATE

## 2023-01-12 NOTE — Patient Instructions (Addendum)
It was a pleasure to see you today.  Have prescribed Ambien 5 mg to try at bedtime.  Urine culture did not have significant growth.May return in one year or as needed.

## 2023-01-20 ENCOUNTER — Other Ambulatory Visit: Payer: Self-pay

## 2023-01-20 ENCOUNTER — Encounter: Payer: Self-pay | Admitting: Internal Medicine

## 2023-01-20 MED ORDER — ZOLPIDEM TARTRATE 5 MG PO TABS: ORAL_TABLET | ORAL | 5 refills | Status: DC

## 2023-03-03 NOTE — Progress Notes (Signed)
 Patient Care Team: Perri Ronal PARAS, MD as PCP - General (Internal Medicine)  Visit Date: 03/04/23  Subjective:   Chief Complaint  Patient presents with   Medication Management   Patient PI:Jennifer Reyes,Female DOB:09/26/1955,67 y.o.MRN:[id number]   68 y.o. Female presents today for follow-up for Insomnia.   History of Insomnia treated with 7.5 mg Ambien . On 01/20/23 via Mychart she sent this message: As instructed, I have been taking 5mg  of the generic Ambien  before bedtime. The medication appears to be working quite well for me with no concerning side effects. I have found that, as we had discussed, taking the 5mg  tablet plus half for a total of 7.5mg , seems to work better for me - especially during periods of IC flares. I will not take more than 7.5mg  nightly and may be able to manage with 5mg  whenever I can. She reports that her medication was initially working, but then she had started experiencing severe pounding headaches that were waking her, so she had been reducing her dosage until she had completed halted. Notes that OTC Melatonin does work for her temporarily, and she has done some research on prescriptions similar to Melatonin that she'd like to try.    Past Medical History:  Diagnosis Date   Bladder pain    Frequency of urination    Hyperlipidemia    Interstitial cystitis    Nocturia    PONV (postoperative nausea and vomiting)    Urgency of urination     Family History  Problem Relation Age of Onset   Heart disease Father    Hypertension Father    Social History   Social History Narrative   Not on file   Review of Systems  Constitutional:  Negative for fever and malaise/fatigue.  HENT:  Negative for congestion.   Eyes:  Negative for blurred vision.  Respiratory:  Negative for cough and shortness of breath.   Cardiovascular:  Negative for chest pain, palpitations and leg swelling.  Gastrointestinal:  Negative for vomiting.  Musculoskeletal:  Negative for  back pain.  Skin:  Negative for rash.  Neurological:  Positive for headaches (reaction to 7.5 mg Ambien ). Negative for loss of consciousness.  Psychiatric/Behavioral:  The patient has insomnia.      Objective:  Vitals: BP 100/70   Pulse 66   Ht 5' 3.25 (1.607 m)   Wt 111 lb (50.3 kg)   SpO2 98%   BMI 19.51 kg/m  Physical Exam Vitals and nursing note reviewed.  Constitutional:      General: She is not in acute distress.    Appearance: Normal appearance. She is not toxic-appearing.  HENT:     Head: Normocephalic and atraumatic.  Pulmonary:     Effort: Pulmonary effort is normal.  Skin:    General: Skin is warm and dry.  Neurological:     Mental Status: She is alert and oriented to person, place, and time. Mental status is at baseline.  Psychiatric:        Mood and Affect: Mood normal.        Behavior: Behavior normal.        Thought Content: Thought content normal.        Judgment: Judgment normal.    Results:  Studies Obtained And Personally Reviewed By Me: Labs:     Component Value Date/Time   NA 139 12/23/2022 1020   K 4.3 12/23/2022 1020   CL 106 12/23/2022 1020   CO2 21 12/23/2022 1020   GLUCOSE 89 12/23/2022  1020   BUN 18 12/23/2022 1020   CREATININE 1.11 (H) 12/23/2022 1020   CALCIUM 9.0 12/23/2022 1020   PROT 6.3 12/23/2022 1020   ALBUMIN 4.7 07/16/2011 0932   AST 15 12/23/2022 1020   ALT 11 12/23/2022 1020   ALKPHOS 45 07/16/2011 0932   BILITOT 0.5 12/23/2022 1020   GFRNONAA 50 (L) 12/09/2019 0938   GFRAA 58 (L) 12/09/2019 0938    Lab Results  Component Value Date   WBC 6.6 12/23/2022   HGB 13.1 12/23/2022   HCT 40.0 12/23/2022   MCV 98.3 12/23/2022   PLT 276 12/23/2022   Lab Results  Component Value Date   CHOL 189 12/23/2022   HDL 67 12/23/2022   LDLCALC 102 (H) 12/23/2022   TRIG 108 12/23/2022   CHOLHDL 2.8 12/23/2022   Lab Results  Component Value Date   HGBA1C 5.7 (H) 12/23/2022    Lab Results  Component Value Date   TSH 1.69  12/23/2022   Assessment & Plan:   Insomnia:  Ambien  caused adverse reaction (severe pounding headache). Sending in 8 mg Rozerem  for insomnia - let us  know if you have any adverse reactions   I,Emily Lagle,acting as a scribe for Ronal JINNY Hailstone, MD.,have documented all relevant documentation on the behalf of Ronal JINNY Hailstone, MD,as directed by  Ronal JINNY Hailstone, MD while in the presence of Ronal JINNY Hailstone, MD.   I, Ronal JINNY Hailstone, MD, have reviewed all documentation for this visit. The documentation on 03/22/23 for the exam, diagnosis, procedures, and orders are all accurate and complete.

## 2023-03-04 ENCOUNTER — Encounter: Payer: Self-pay | Admitting: Internal Medicine

## 2023-03-04 ENCOUNTER — Ambulatory Visit: Payer: Medicare Other | Admitting: Internal Medicine

## 2023-03-04 VITALS — BP 100/70 | HR 66 | Ht 63.25 in | Wt 111.0 lb

## 2023-03-04 DIAGNOSIS — G4700 Insomnia, unspecified: Secondary | ICD-10-CM | POA: Diagnosis not present

## 2023-03-04 MED ORDER — RAMELTEON 8 MG PO TABS: 8.0000 mg | ORAL_TABLET | Freq: Every day | ORAL | 0 refills | Status: DC

## 2023-03-04 NOTE — Patient Instructions (Addendum)
 Changing sleep medication to Rozerem 8 mg at bedtime.

## 2023-03-24 ENCOUNTER — Other Ambulatory Visit: Payer: Self-pay | Admitting: Internal Medicine

## 2023-03-24 MED ORDER — RAMELTEON 8 MG PO TABS: 8.0000 mg | ORAL_TABLET | Freq: Every day | ORAL | 0 refills | Status: DC

## 2023-03-24 NOTE — Telephone Encounter (Signed)
Jennifer Reyes [phone]  Eddie called wanting to get her below medication early because [place] and the medicine is not due to be filled until 2/7/205. Eddie did say this medication was working well for her. She also ask if she could get 90 day refills so maybe she would not run into this problem in the future.   I called the pharmacy and they said if you put a note in the prescription that the patient was leaving to go on vacation this Saturday they could fill it this time early for her.  ramelteon (ROZEREM) 8 MG tablet   [facility] [id number] - [place], [place] - [address], [place] [address] Phone: [phone]  Fax: [phone]

## 2023-03-25 NOTE — Telephone Encounter (Signed)
Jatziry called back and said this medication is not covered by her insurance and she will pay out of pocket or use a card to get it.

## 2023-03-28 ENCOUNTER — Telehealth: Payer: Self-pay | Admitting: Internal Medicine

## 2023-03-28 NOTE — Telephone Encounter (Signed)
Copied from CRM [id number]. Topic: General - Other >> Mar 28, 2023  1:25 PM Fonda Kinder J wrote: Reason for CRM: [facility] called stating they need all Tried and failed alternative medications for the pt if there are any regarding an appeal for denial for ramelteon. This information can be called in to [phone] or fax [phone]

## 2023-03-28 NOTE — Telephone Encounter (Addendum)
Jennifer Reyes [phone]  Tessa called to say that below medication is not her formulary with her insurance company but if we can send back that she has tried generic form of Ambien and has to have name brand she should be to get this medication according to her from her insurance. I have printed the forms.  ramelteon (ROZEREM) 8 MG tablet

## 2023-03-28 NOTE — Telephone Encounter (Signed)
Continuation of below note.

## 2023-03-28 NOTE — Telephone Encounter (Signed)
-------  Fax Transmission Report-------  To:               Recipient at [phone] Subject:          Fw: Hp Scans Result:           The transmission was successful. Explanation:      All Pages Ok Pages Sent:       4 Connect Time:     1 minutes, 15 seconds Transmit Time:    03/28/2023 16:37 Transfer Rate:    14400 Status Code:      0000 Retry Count:      0 Job Id:           [id number] Unique Id:        [id number] Fax Line:         6 Fax Server:       [employer]

## 2023-03-28 NOTE — Telephone Encounter (Addendum)
Received notice from [facility] RAMELTON 8mg  is approved from 03/24/23 until further notice. Faxed to [facility] [id number] - [place], [place] - [address]

## 2023-04-14 ENCOUNTER — Other Ambulatory Visit: Payer: Medicare Other

## 2023-05-23 ENCOUNTER — Other Ambulatory Visit: Payer: Self-pay | Admitting: *Deleted

## 2023-06-03 MED ORDER — RAMELTEON 8 MG PO TABS: 8.0000 mg | ORAL_TABLET | Freq: Every day | ORAL | 0 refills | Status: DC

## 2023-07-23 ENCOUNTER — Other Ambulatory Visit: Payer: Self-pay

## 2023-07-23 MED ORDER — VALACYCLOVIR HCL 500 MG PO TABS: ORAL_TABLET | ORAL | 3 refills | Status: AC

## 2023-09-03 ENCOUNTER — Other Ambulatory Visit: Payer: Self-pay

## 2023-09-03 MED ORDER — RAMELTEON 8 MG PO TABS: 8.0000 mg | ORAL_TABLET | Freq: Every day | ORAL | 1 refills | Status: DC

## 2023-09-23 NOTE — Telephone Encounter (Signed)
 SABRA

## 2023-12-19 ENCOUNTER — Other Ambulatory Visit

## 2023-12-19 DIAGNOSIS — E7800 Pure hypercholesterolemia, unspecified: Secondary | ICD-10-CM

## 2023-12-19 DIAGNOSIS — Z1329 Encounter for screening for other suspected endocrine disorder: Secondary | ICD-10-CM

## 2023-12-19 DIAGNOSIS — M8580 Other specified disorders of bone density and structure, unspecified site: Secondary | ICD-10-CM

## 2023-12-19 DIAGNOSIS — Z131 Encounter for screening for diabetes mellitus: Secondary | ICD-10-CM

## 2023-12-19 DIAGNOSIS — Z13220 Encounter for screening for lipoid disorders: Secondary | ICD-10-CM

## 2023-12-19 DIAGNOSIS — Z0000 Encounter for general adult medical examination without abnormal findings: Secondary | ICD-10-CM

## 2023-12-20 LAB — LIPID PANEL
Cholesterol: 214 mg/dL — ABNORMAL HIGH (ref <200)
HDL: 77 mg/dL (ref >=50)
LDL Cholesterol (Calc): 118 mg/dL — ABNORMAL HIGH
Non-HDL Cholesterol (Calc): 137 mg/dL — ABNORMAL HIGH (ref <130)
Total CHOL/HDL Ratio: 2.8 (calc) (ref <5.0)
Triglycerides: 93 mg/dL (ref <150)

## 2023-12-20 LAB — CBC WITH DIFFERENTIAL/PLATELET
Absolute Lymphocytes: 1783 {cells}/uL (ref 850–3900)
Absolute Monocytes: 622 {cells}/uL (ref 200–950)
Basophils Absolute: 59 {cells}/uL (ref 0–200)
Basophils Relative: 0.8 %
Eosinophils Absolute: 274 {cells}/uL (ref 15–500)
Eosinophils Relative: 3.7 %
HCT: 38.5 % (ref 35.0–45.0)
Hemoglobin: 12.8 g/dL (ref 11.7–15.5)
MCH: 32.7 pg (ref 27.0–33.0)
MCHC: 33.2 g/dL (ref 32.0–36.0)
MCV: 98.5 fL (ref 80.0–100.0)
MPV: 10.4 fL (ref 7.5–12.5)
Monocytes Relative: 8.4 %
Neutro Abs: 4662 {cells}/uL (ref 1500–7800)
Neutrophils Relative %: 63 %
Platelets: 308 Thousand/uL (ref 140–400)
RBC: 3.91 Million/uL (ref 3.80–5.10)
RDW: 12.1 % (ref 11.0–15.0)
Total Lymphocyte: 24.1 %
WBC: 7.4 Thousand/uL (ref 3.8–10.8)

## 2023-12-20 LAB — COMPREHENSIVE METABOLIC PANEL WITH GFR
AG Ratio: 2.2 (calc) (ref 1.0–2.5)
ALT: 18 U/L (ref 6–29)
AST: 18 U/L (ref 10–35)
Albumin: 4.6 g/dL (ref 3.6–5.1)
Alkaline phosphatase (APISO): 47 U/L (ref 37–153)
BUN/Creatinine Ratio: 17 (calc) (ref 6–22)
BUN: 19 mg/dL (ref 7–25)
CO2: 26 mmol/L (ref 20–32)
Calcium: 9.5 mg/dL (ref 8.6–10.4)
Chloride: 105 mmol/L (ref 98–110)
Creat: 1.12 mg/dL — ABNORMAL HIGH (ref 0.50–1.05)
Globulin: 2.1 g/dL (ref 1.9–3.7)
Glucose, Bld: 84 mg/dL (ref 65–99)
Potassium: 5.1 mmol/L (ref 3.5–5.3)
Sodium: 140 mmol/L (ref 135–146)
Total Bilirubin: 0.5 mg/dL (ref 0.2–1.2)
Total Protein: 6.7 g/dL (ref 6.1–8.1)
eGFR: 53 mL/min/1.73m2 — ABNORMAL LOW (ref >=60)

## 2023-12-20 LAB — TSH: TSH: 1.06 m[IU]/L (ref 0.40–4.50)

## 2024-01-02 ENCOUNTER — Other Ambulatory Visit: Payer: Medicare Other

## 2024-01-08 ENCOUNTER — Ambulatory Visit: Payer: Medicare Other | Admitting: Internal Medicine

## 2024-01-08 NOTE — Progress Notes (Signed)
 Annual Wellness Visit   Patient Care Team: Perri Ronal PARAS, MD as PCP - General (Internal Medicine)  Visit Date: 01/13/24   Chief Complaint  Patient presents with   Annual Exam   Subjective:  Patient: Jennifer Reyes, Female DOB: 11-Apr-1955, 68 y.o. MRN: [id number] Vitals:   01/13/24 1355  BP: 108/70   Jennifer Reyes is a 68 y.o. Female who presents today for her Annual Wellness Visit. Patient has Interstitial cystitis; Osteopenia; Lung nodules; High-tone pelvic floor dysfunction; and Vaginal atrophy on their problem list.  History of mild impaired glucose intolerance. 12/23/2022 HgbA1c 5.7%  History of interstitial cystitis and UTIs and followed by urologist, Dr. Lamar Sar. She has had mona lisa procedure which helped with vaginal atrophy and dyspareunia. Pain was managed with Oxycodone . Unable to get Oxycodone  APAP due to staffing changes and PA has informed her that they are no longer prescribing pain medication. She is concerned about this because she has been taking this medication reliably  and responsibly when she had severe bladder pain.   History of insomnia treated with Ramelteon  8 mg at bedtime.   Patient had colonoscopy in 2014 by Dr. Luis with 10-year follow-up recommended.      History of mild osteopenia. 02/06/22 bone density scan showed T-score -1.9 at right femur neck.   Evaluated for palpitations in 1991. History of frozen right shoulder and frozen left shoulder in the remote past. Tonsillectomy 1961. Pilonidal cyst 1974. Has undergone multiple urethral dilatations. D&C for miscarriage 1995. Anal fissure repair with Clorpactin treatment January 1994. Hydrodilatation of bladder and cystoscopy August 1997.   Patient had COVID in June 2022.  She had protracted maxillary sinus issues and was seen by video visit in late June.  Was treated at that time with Zithromax  and prednisone .   History of lung nodules and was seen by Dr. Adine Gift on 11/28/21. 11/22/21 CT  chest showed: 1) overall, compared to the prior examination, the majority of the previously noted basilar pulmonary nodules have regressed, indicative of a benign etiology. There are some new nodules measuring 5 mm or less in size, nonspecific, but statistically likely benign, 2) Aortic atherosclerosis. Repeat CT chest recommended in 2025.   Labs 12/19/2023 Creatinine 1.12, eGFR 53, CHOL 214, LDL 118, Otherwise WNL.    02/06/2022 Mammogram no mammographic evidence of malignancy. Repeat in one year.       Health Maintenance  Topic Date Due   Zoster Vaccines- Shingrix (1 of 2) 01/29/2004   Medicare Annual Wellness (AWV)  01/02/2024   Mammogram  02/09/2024   DEXA SCAN  02/07/2024   DTaP/Tdap/Td (3 - Td or Tdap) 06/02/2024   Pneumococcal Vaccine: 50+ Years  Completed   Influenza Vaccine  Completed   Meningococcal B Vaccine  Aged Out   Colonoscopy  Discontinued   COVID-19 Vaccine  Discontinued   Hepatitis C Screening  Discontinued     Review of Systems  Constitutional:  Negative for fever and malaise/fatigue.  HENT:  Negative for congestion.   Eyes:  Negative for blurred vision.  Respiratory:  Negative for cough and shortness of breath.   Cardiovascular:  Negative for chest pain, palpitations and leg swelling.  Gastrointestinal:  Negative for vomiting.  Musculoskeletal:  Negative for back pain.  Skin:  Negative for rash.  Neurological:  Negative for loss of consciousness and headaches.   Objective:  Vitals: body mass index is 20.03 kg/m. Today's Vitals   01/13/24 1355  BP: 108/70  Pulse: 76  SpO2:  98%  Weight: 114 lb (51.7 kg)  Height: 5' 3.25 (1.607 m)  PainSc: 0-No pain   Physical Exam Vitals and nursing note reviewed.  Constitutional:      General: She is not in acute distress.    Appearance: Normal appearance. She is not ill-appearing or toxic-appearing.  HENT:     Head: Normocephalic and atraumatic.     Right Ear: Hearing, tympanic membrane, ear canal and  external ear normal.     Left Ear: Hearing, tympanic membrane, ear canal and external ear normal.     Mouth/Throat:     Pharynx: Oropharynx is clear.  Eyes:     Extraocular Movements: Extraocular movements intact.     Pupils: Pupils are equal, round, and reactive to light.  Neck:     Thyroid: No thyroid mass, thyromegaly or thyroid tenderness.     Vascular: No carotid bruit.  Cardiovascular:     Rate and Rhythm: Normal rate and regular rhythm. No extrasystoles are present.    Pulses:          Dorsalis pedis pulses are 2+ on the right side and 2+ on the left side.     Heart sounds: Normal heart sounds. No murmur heard.    No friction rub. No gallop.  Pulmonary:     Effort: Pulmonary effort is normal.     Breath sounds: Normal breath sounds. No decreased breath sounds, wheezing, rhonchi or rales.  Chest:     Chest wall: No mass.  Abdominal:     Palpations: Abdomen is soft. There is no hepatomegaly, splenomegaly or mass.     Tenderness: There is no abdominal tenderness.     Hernia: No hernia is present.  Musculoskeletal:     Cervical back: Normal range of motion.     Right lower leg: No edema.     Left lower leg: No edema.  Lymphadenopathy:     Cervical: No cervical adenopathy.     Upper Body:     Right upper body: No supraclavicular adenopathy.     Left upper body: No supraclavicular adenopathy.  Skin:    General: Skin is warm and dry.  Neurological:     General: No focal deficit present.     Mental Status: She is alert and oriented to person, place, and time. Mental status is at baseline.     Sensory: Sensation is intact.     Motor: Motor function is intact. No weakness.     Deep Tendon Reflexes: Reflexes are normal and symmetric.  Psychiatric:        Attention and Perception: Attention normal.        Mood and Affect: Mood normal.        Speech: Speech normal.        Behavior: Behavior normal.        Thought Content: Thought content normal.        Cognition and Memory:  Cognition normal.        Judgment: Judgment normal.     Current Outpatient Medications  Medication Instructions   acyclovir  ointment (ZOVIRAX ) 5 % 1 Application, Topical, Every  3 hours   bupivacaine  (MARCAINE ) 0.5 % injection 15 mLs, As needed   calcium carbonate (OS-CAL) 600 mg, Every other day   cholecalciferol (VITAMIN D ) 1,000 Units, Daily   ESTRACE VAGINAL 0.1 MG/GM vaginal cream 1 Applicatorful, 2 times weekly   oxybutynin (DITROPAN) 5 mg, Daily at bedtime   oxyCODONE -acetaminophen  (PERCOCET) 7.5-325 MG tablet 1 tablet, Every 4 hours  PRN   Pentosan Polysulfate Sodium 150 mg, 2 times daily   ramelteon  (ROZEREM ) 8 mg, Oral, Daily at bedtime, patient is leaving to go on vacation- please fill it early   valACYclovir  (VALTREX ) 500 MG tablet TAKE ONE TABLET BY MOUTH DAILY FOR PROPHYLAXIS   Past Medical History:  Diagnosis Date   Bladder pain    Frequency of urination    Hyperlipidemia    Interstitial cystitis    Nocturia    PONV (postoperative nausea and vomiting)    Urgency of urination    Medical/Surgical History Narrative:  Allergic/Intolerant to:  Allergies  Allergen Reactions   Penicillins Hives   Ambien  [Zolpidem ]     Severe headache   Levofloxacin  Nausea And Vomiting   Meperidine  Nausea And Vomiting   Prednisone      Headache, Nausea    Past Surgical History:  Procedure Laterality Date   BRONCHIAL BIOPSY  12/26/2020   Procedure: BRONCHIAL BIOPSIES;  Surgeon: Brenna Adine CROME, DO;  Location: [facility];  Service: Pulmonary;;   BRONCHIAL BRUSHINGS  12/26/2020   Procedure: BRONCHIAL BRUSHINGS;  Surgeon: Brenna Adine CROME, DO;  Location: [facility];  Service: Pulmonary;;   BRONCHIAL NEEDLE ASPIRATION BIOPSY  12/26/2020   Procedure: BRONCHIAL NEEDLE ASPIRATION BIOPSIES;  Surgeon: Brenna Adine CROME, DO;  Location: [facility];  Service: Pulmonary;;   BRONCHIAL WASHINGS  12/26/2020   Procedure: BRONCHIAL WASHINGS;  Surgeon: Brenna Adine CROME, DO;  Location: [facility];  Service: Pulmonary;;   BRONCHOSCOPY  12/26/2020   CYSTO WITH HYDRODISTENSION  12/23/2011   Procedure: CYSTOSCOPY/HYDRODISTENSION;  Surgeon: Alm GORMAN Fragmin, MD;  Location: [facility];  Service: Urology;  Laterality: N/A;  30 MIN CYSTO, HYDRODISTENSION, URETHRAL CALIBRATION, INSTILLATION MARCAINEAND PYRIDIUM , INJECTION MARCAINE  AND KENALOG    CYSTO/ HOD/ URETHRAL DILATION / INSTILLATION THERAPY  10/11/2005   DILATION AND CURETTAGE OF UTERUS     LEFT CLOSED SHOULDER MANIPULATION  04/06/2004   CHRONIC ADHESIVE  CAPSULITIS   MASS EXCISION Left 06/04/2012   Procedure: EXCISIONAL BIOPSY OF MASS LEFT RING FINGER;  Surgeon: Lamar LULLA Leonor Mickey., MD;  Location: [facility];  Service: Orthopedics;  Laterality: Left;   PILONIDAL CYST EXCISION     TONSILLECTOMY     TUBAL LIGATION     VIDEO BRONCHOSCOPY WITH ENDOBRONCHIAL NAVIGATION Bilateral 12/26/2020   Procedure: VIDEO BRONCHOSCOPY WITH ENDOBRONCHIAL NAVIGATION;  Surgeon: Brenna Adine CROME, DO;  Location: [facility];  Service: Pulmonary;  Laterality: Bilateral;  ION   VIDEO BRONCHOSCOPY WITH RADIAL ENDOBRONCHIAL ULTRASOUND  12/26/2020   Procedure: VIDEO BRONCHOSCOPY WITH RADIAL ENDOBRONCHIAL ULTRASOUND;  Surgeon: Brenna Adine CROME, DO;  Location: [facility];  Service: Pulmonary;;   Family History  Problem Relation Age of Onset   Heart disease Father    Hypertension Father    Family History Narrative:  She is married. 1 adult son. 20 grandchildren. Husband is a retired biochemist, clinical. She has a BS degree in tree surgeon. She is a non-smoker.   Most Recent Health Risks Assessment:   Most Recent Social Determinants of Health (Including Hx of Tobacco, Alcohol, and Drug Use) SDOH Screenings   Food Insecurity: Low Risk  (11/24/2023)   Received from [facility]  Housing: Low Risk  (11/24/2023)   Received from [facility]  Transportation Needs: No Transportation Needs (11/24/2023)   [facility] from [facility]  Utilities: Low Risk  (11/24/2023)   Received from [facility]  Alcohol Screen: Low Risk  (01/14/2023)  Depression (PHQ2-9): Low Risk  (01/13/2024)  Financial  Resource Strain: Low Risk  (01/14/2023)  Physical Activity: Sufficiently Active (01/14/2023)  Social Connections: Socially Integrated (01/14/2023)  Stress: No Stress Concern Present (01/14/2023)  Tobacco Use: Low Risk  (01/13/2024)  Health Literacy: Adequate Health Literacy (01/14/2023)   Social History   Tobacco Use   Smoking status: Never   Smokeless tobacco: Never  Vaping Use   Vaping status: Never Used  Substance Use Topics   Alcohol use: No   Drug use: No   Most Recent Functional Status Assessment:    01/14/2023    2:51 PM  In your present state of health, do you have any difficulty performing the following activities:  Hearing? 0  Vision? 0  Difficulty concentrating or making decisions? 0  Walking or climbing stairs? 0  Dressing or bathing? 0  Doing errands, shopping? 0  Preparing Food and eating ? N  Using the Toilet? N  In the past six months, have you accidently leaked urine? Y  Do you have problems with loss of bowel control? N  Managing your Medications? N  Managing your Finances? N  Housekeeping or managing your Housekeeping? N   Most Recent Fall Risk Assessment:    01/14/2023    2:51 PM  Fall Risk   Falls in the past year? 0  Number falls in past yr: 0  Injury with Fall? 0   Most Recent Anxiety/Depression Screenings:    01/13/2024    2:00 PM 01/02/2023   10:57 AM  PHQ 2/9 Scores  PHQ - 2 Score 0 0    Most Recent Cognitive Screening:    01/02/2023   10:57 AM  6CIT Screen  What Year? 0 points  What month? 0 points  What time? 0 points  Count back from 20 0 points  Months in reverse 0 points  Repeat phrase 0 points  Total Score 0 points    Results:    02/06/2022 Mammogram no mammographic evidence of malignancy. Repeat in one year.    Labs:  CBC w/ Differential Lab  Results  Component Value Date   WBC 7.4 12/19/2023   RBC 3.91 12/19/2023   HGB 12.8 12/19/2023   HCT 38.5 12/19/2023   PLT 308 12/19/2023   MCV 98.5 12/19/2023   MCH 32.7 12/19/2023   MCHC 33.2 12/19/2023   RDW 12.1 12/19/2023   MPV 10.4 12/19/2023   LYMPHSABS 1,242 12/20/2021   BASOSABS 59 12/19/2023    Comprehensive Metabolic Panel Lab Results  Component Value Date   NA 140 12/19/2023   K 5.1 12/19/2023   CL 105 12/19/2023   CO2 26 12/19/2023   GLUCOSE 84 12/19/2023   BUN 19 12/19/2023   CREATININE 1.12 (H) 12/19/2023   CALCIUM 9.5 12/19/2023   PROT 6.7 12/19/2023   ALBUMIN 4.7 07/16/2011   AST 18 12/19/2023   ALT 18 12/19/2023   ALKPHOS 45 07/16/2011   BILITOT 0.5 12/19/2023   EGFR 53 (L) 12/19/2023   GFRNONAA 50 (L) 12/09/2019   Lipid Panel  Lab Results  Component Value Date   CHOL 214 (H) 12/19/2023   HDL 77 12/19/2023   LDLCALC 118 (H) 12/19/2023   TRIG 93 12/19/2023   A1c Lab Results  Component Value Date   HGBA1C 5.7 (H) 12/23/2022    TSH Lab Results  Component Value Date   TSH 1.06 12/19/2023    Results for orders placed or performed in visit on 01/13/24  POCT URINALYSIS DIP (CLINITEK)  Result Value Ref Range   Color, UA yellow yellow  Clarity, UA clear clear   Glucose, UA negative negative mg/dL   Bilirubin, UA negative negative   Ketones, POC UA negative negative mg/dL   Spec Grav, UA 8.989 8.989 - 1.025   Blood, UA negative negative   pH, UA 6.5 5.0 - 8.0   POC PROTEIN,UA negative negative, trace   Urobilinogen, UA 0.2 0.2 or 1.0 E.U./dL   Nitrite, UA Negative Negative   Leukocytes, UA Negative Negative   Assessment & Plan:   Orders Placed This Encounter  Procedures   POCT URINALYSIS DIP (CLINITEK)   Mild impaired glucose intolerance: 12/23/2022 HgbA1c 5.7%  interstitial cystitis and UTIs:  followed by urologist, Dr. Lamar Sar. She has had mona lisa procedure which helped with vaginal atrophy and dyspareunia. Pain was  managed with Oxycodone . Unable to get Oxycodone  APAP due to staffing changes and PA has informed her that they are no longer prescribing pain medication. She is concerned about this because she has been taking this medication reliably  and responsibly when she had severe bladder pain.   Insomnia: treated with Rozerem  generic 8 mg at bedtime. This is well tolerated and she has no side effects.  .   Patient had colonoscopy in 2014 by Dr. Luis with 10-year follow-up recommended. Colonoscopy due now.   Mild osteopenia: 02/06/22 bone density scan showed T-score -1.9 at right femur neck.   02/06/2022 Mammogram no mammographic evidence of malignancy. Repeat in one year.       Annual Wellness Visit done today including the all of the following: Reviewed patient's Family Medical History Reviewed patient's SDOH and reviewed tobacco, alcohol, and drug use.  Reviewed and updated list of patient's medical providers Assessment of cognitive impairment was done Assessed patient's functional ability Established a written schedule for health screening services Health Risk Assessent Completed and Reviewed  Discussed health benefits of physical activity, and encouraged her to engage in regular exercise appropriate for her age and condition.    I,Makayla C Reid,acting as a scribe for Ronal JINNY Hailstone, MD.,have documented all relevant documentation on the behalf of Ronal JINNY Hailstone, MD,as directed by  Ronal JINNY Hailstone, MD while in the presence of Ronal JINNY Hailstone, MD.  I, Ronal JINNY Hailstone, MD, have reviewed all documentation for and agree with the above Annual Wellness Visit documentation.  Ronal JINNY Hailstone, MD Internal Medicine 01/13/2024

## 2024-01-13 ENCOUNTER — Encounter: Payer: Self-pay | Admitting: Internal Medicine

## 2024-01-13 ENCOUNTER — Ambulatory Visit: Payer: Self-pay | Admitting: Internal Medicine

## 2024-01-13 VITALS — BP 108/70 | HR 76 | Ht 63.25 in | Wt 114.0 lb

## 2024-01-13 DIAGNOSIS — R7302 Impaired glucose tolerance (oral): Secondary | ICD-10-CM

## 2024-01-13 DIAGNOSIS — Z0000 Encounter for general adult medical examination without abnormal findings: Secondary | ICD-10-CM | POA: Diagnosis not present

## 2024-01-13 DIAGNOSIS — G4700 Insomnia, unspecified: Secondary | ICD-10-CM | POA: Diagnosis not present

## 2024-01-13 DIAGNOSIS — M8580 Other specified disorders of bone density and structure, unspecified site: Secondary | ICD-10-CM

## 2024-01-13 DIAGNOSIS — N3010 Interstitial cystitis (chronic) without hematuria: Secondary | ICD-10-CM | POA: Diagnosis not present

## 2024-01-13 LAB — POCT URINALYSIS DIP (CLINITEK)
Bilirubin, UA: NEGATIVE
Blood, UA: NEGATIVE
Glucose, UA: NEGATIVE mg/dL
Ketones, POC UA: NEGATIVE mg/dL
Leukocytes, UA: NEGATIVE
Nitrite, UA: NEGATIVE
POC PROTEIN,UA: NEGATIVE
Spec Grav, UA: 1.01 (ref 1.010–1.025)
Urobilinogen, UA: 0.2 U/dL
pH, UA: 6.5 (ref 5.0–8.0)

## 2024-01-24 ENCOUNTER — Encounter: Payer: Self-pay | Admitting: Internal Medicine

## 2024-01-24 NOTE — Patient Instructions (Signed)
 It was a pleasure to see you today. We discussed need for screening colonoscopy and likelihood of needing to find new Urologist. Medications discussed. She takes them reliably and responsibly. These work well for her given her longstanding history of interstitial cystitis. Return in one year or as needed.

## 2024-01-27 ENCOUNTER — Encounter: Payer: Self-pay | Admitting: Internal Medicine

## 2024-03-08 ENCOUNTER — Other Ambulatory Visit: Payer: Self-pay

## 2024-03-08 MED ORDER — RAMELTEON 8 MG PO TABS: 8.0000 mg | ORAL_TABLET | Freq: Every day | ORAL | 1 refills | Status: AC

## 2025-01-17 ENCOUNTER — Other Ambulatory Visit

## 2025-01-24 ENCOUNTER — Ambulatory Visit: Admitting: Internal Medicine
